# Patient Record
Sex: Female | Born: 1995 | Race: Black or African American | Hispanic: No | Marital: Single | Smoking: Never smoker
Health system: Southern US, Community
[De-identification: ages and names within clinical notes are randomized; demographics above are authoritative.]

## PROBLEM LIST (undated history)

## (undated) ENCOUNTER — Inpatient Hospital Stay (HOSPITAL_COMMUNITY): Payer: Self-pay

## (undated) DIAGNOSIS — F909 Attention-deficit hyperactivity disorder, unspecified type: Secondary | ICD-10-CM

## (undated) HISTORY — PX: NO PAST SURGERIES: SHX2092

---
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
## 2017-01-12 ENCOUNTER — Encounter (HOSPITAL_COMMUNITY): Payer: Self-pay | Admitting: Emergency Medicine

## 2017-01-12 ENCOUNTER — Emergency Department (HOSPITAL_COMMUNITY)
Admission: EM | Admit: 2017-01-12 | Discharge: 2017-01-12 | Disposition: A | Discharge status: Home / Self Care | Payer: Medicaid Other | Attending: Emergency Medicine | Admitting: Emergency Medicine

## 2017-01-12 DIAGNOSIS — F909 Attention-deficit hyperactivity disorder, unspecified type: Secondary | ICD-10-CM | POA: Insufficient documentation

## 2017-01-12 DIAGNOSIS — M545 Low back pain, unspecified: Secondary | ICD-10-CM

## 2017-01-12 LAB — PREGNANCY, URINE: Preg Test, Ur: NEGATIVE

## 2017-01-12 MED ORDER — IBUPROFEN 800 MG PO TABS: 800.0000 mg | ORAL_TABLET | Freq: Three times a day (TID) | ORAL | 0 refills | Status: DC | PRN

## 2017-01-12 NOTE — ED Triage Notes (Signed)
Pt states back pain that started 3 days ago while at work, when she stretches her back is when it is worse. Non tender to palpation. Pt states she is also on her period and might be period cramps. Also states "it feels like I need to pop my back and cannot."

## 2017-01-12 NOTE — ED Notes (Signed)
Pt ambulated to room from waiting room, tolerated well. 

## 2017-01-12 NOTE — Discharge Instructions (Signed)

## 2017-01-12 NOTE — ED Provider Notes (Signed)
Emergency Department Provider Note   I have reviewed the triage vital signs and the nursing notes.   HISTORY  Chief Complaint Back Pain   HPI Joanna Henry is a 21 y.o. female with PMH of ADHD presents to the emergency department for evaluation of lower back pain worse with movement.  She denies any vaginal discharge.  She is currently on her menstrual cycle and reports a negative pregnancy test at home today x2.  She denies any abdominal discomfort.  She states the pain began suddenly with lifting a large box at work.  She is tried Tylenol at home with significant relief in symptoms but came in today for advice on which pain medications to take.  She denies any allergies to medications.  No tingling or numbness in the arms or legs.  No difficulty walking.  No bowel or bladder incontinence.    Past Medical History:  Diagnosis Date  . ADHD (attention deficit hyperactivity disorder)     Patient Active Problem List   Diagnosis Date Noted  . 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  Current Outpatient Rx  . Order #: [id number] Class: Print  . Order #: [id number] Class: Print  . Order #: [id number] Class: Print    Allergies Patient has no known allergies.  No family history on file.  Social History Social History   Tobacco Use  . Smoking status: Never Smoker  . Smokeless tobacco: Never Used  Substance Use Topics  . Alcohol use: No  . Drug use: Yes    Types: Marijuana    Comment: daily    Review of Systems  Constitutional: No fever/chills Eyes: No visual changes. ENT: No sore throat. Cardiovascular: Denies chest pain. Respiratory: Denies shortness of breath. Gastrointestinal: No abdominal pain.  No nausea, no vomiting.  No diarrhea.  No constipation. Genitourinary: Negative for dysuria. Musculoskeletal: Positive for lower back pain. Skin: Negative for rash. Neurological: Negative for  headaches, focal weakness or numbness.  10-point ROS otherwise negative.  ____________________________________________   PHYSICAL EXAM:  VITAL SIGNS: ED Triage Vitals  Enc Vitals Group     BP 01/12/17 1754 120/85     Pulse Rate 01/12/17 1754 83     Resp 01/12/17 1754 16     Temp 01/12/17 1754 98.1 F (36.7 C)     Temp Source 01/12/17 1754 Oral     SpO2 01/12/17 1754 100 %     Pain Score 01/12/17 1759 7    Constitutional: Alert and oriented. Well appearing and in no acute distress. Eyes: Conjunctivae are normal.  Head: Atraumatic. Nose: No congestion/rhinnorhea. Mouth/Throat: Mucous membranes are moist.  Neck: No stridor.  Cardiovascular: Good peripheral circulation.  Respiratory: Normal respiratory effort.   Gastrointestinal: Soft and nontender. No distention.  Musculoskeletal: No lower extremity tenderness nor edema. No gross deformities of extremities. Mild diffuse pain to the lower back with no focal midline discomfort.  Neurologic:  Normal speech and language. No gross focal neurologic deficits are appreciated.  Skin:  Skin is warm, dry and intact. No rash noted.  ____________________________________________   LABS (all labs ordered are listed, but only abnormal results are displayed)  Labs Reviewed  PREGNANCY, URINE   ____________________________________________  RADIOLOGY  None ____________________________________________   PROCEDURES  Procedure(s) performed:   Procedures  None ____________________________________________   INITIAL IMPRESSION / ASSESSMENT AND PLAN / ED COURSE  Pertinent labs & imaging results that were available during my care of the patient  were reviewed by me and considered in my medical decision making (see chart for details).  Patient presents the emergency department for evaluation of lower back pain.  She has diffuse lower back discomfort with no midline spine tenderness.  No red flag signs or symptoms to suggest cauda  equina or other spinal cord emergency.  No lower abdominal tenderness on exam.Urine pregnancy negative. Advised her to take Tylenol at home with topical pain medications.  She will follow with primary care physician as needed.  At this time, I do not feel there is any life-threatening condition present. I have reviewed and discussed all results (EKG, imaging, lab, urine as appropriate), exam findings with patient. I have reviewed nursing notes and appropriate previous records.  I feel the patient is safe to be discharged home without further emergent workup. Discussed usual and customary return precautions. Patient and family (if present) verbalize understanding and are comfortable with this plan.  Patient will follow-up with their primary care provider. If they do not have a primary care provider, information for follow-up has been provided to them. All questions have been answered.  ____________________________________________  FINAL CLINICAL IMPRESSION(S) / ED DIAGNOSES  Final diagnoses:  Acute bilateral low back pain without sciatica     MEDICATIONS GIVEN DURING THIS VISIT:  None  NEW OUTPATIENT MEDICATIONS STARTED DURING THIS VISIT:  Motrin 800 mg   Note:  This document was prepared using Dragon voice recognition software and may include unintentional dictation errors.  Alona BeneJoshua Blinda Turek, MD Emergency Medicine    Zykee Avakian, Arlyss RepressJoshua G, MD 01/12/17 2114

## 2017-01-12 NOTE — ED Notes (Signed)
Pt verbalized understanding discharge instructions and denies any further needs or questions at this time. VS stable, ambulatory and steady gait.   

## 2017-01-15 ENCOUNTER — Ambulatory Visit (INDEPENDENT_AMBULATORY_CARE_PROVIDER_SITE_OTHER): Payer: Self-pay | Admitting: Physician Assistant

## 2017-03-31 ENCOUNTER — Encounter (HOSPITAL_COMMUNITY): Payer: Self-pay

## 2017-03-31 ENCOUNTER — Other Ambulatory Visit: Payer: Self-pay

## 2017-03-31 DIAGNOSIS — Z5321 Procedure and treatment not carried out due to patient leaving prior to being seen by health care provider: Secondary | ICD-10-CM | POA: Insufficient documentation

## 2017-03-31 DIAGNOSIS — R109 Unspecified abdominal pain: Secondary | ICD-10-CM | POA: Insufficient documentation

## 2017-03-31 LAB — URINALYSIS, ROUTINE W REFLEX MICROSCOPIC
BILIRUBIN URINE: NEGATIVE
GLUCOSE, UA: NEGATIVE mg/dL
Hgb urine dipstick: NEGATIVE
KETONES UR: NEGATIVE mg/dL
Leukocytes, UA: NEGATIVE
NITRITE: NEGATIVE
Protein, ur: NEGATIVE mg/dL
Specific Gravity, Urine: 1.025 (ref 1.005–1.030)
pH: 5 (ref 5.0–8.0)

## 2017-03-31 NOTE — ED Notes (Signed)
Pt refused blood work, will provide urine sample.

## 2017-03-31 NOTE — ED Notes (Signed)
Results reviewed

## 2017-03-31 NOTE — ED Triage Notes (Signed)
Pt states she has been having abdominal pain X3 days. States she had a large bowel movement and has been having rectal pain since. Pt states this has resolved, now has abd pain and vomiting after eating.

## 2017-04-01 ENCOUNTER — Emergency Department (HOSPITAL_COMMUNITY)
Admission: EM | Admit: 2017-04-01 | Discharge: 2017-04-01 | Discharge status: Against Medical Advice | Payer: Medicaid Other | Attending: Emergency Medicine | Admitting: Emergency Medicine

## 2017-04-01 NOTE — ED Notes (Signed)
No answer x3

## 2017-04-27 ENCOUNTER — Other Ambulatory Visit: Payer: Self-pay

## 2017-04-27 ENCOUNTER — Ambulatory Visit (HOSPITAL_COMMUNITY)
Admission: EM | Admit: 2017-04-27 | Discharge: 2017-04-27 | Disposition: A | Discharge status: Home / Self Care | Payer: Medicaid Other | Attending: Physician Assistant | Admitting: Physician Assistant

## 2017-04-27 ENCOUNTER — Ambulatory Visit (INDEPENDENT_AMBULATORY_CARE_PROVIDER_SITE_OTHER): Payer: Medicaid Other

## 2017-04-27 ENCOUNTER — Encounter (HOSPITAL_COMMUNITY): Payer: Self-pay | Admitting: Emergency Medicine

## 2017-04-27 DIAGNOSIS — M79641 Pain in right hand: Secondary | ICD-10-CM

## 2017-04-27 NOTE — Discharge Instructions (Signed)
Rest ice compression elevation.

## 2017-04-27 NOTE — ED Triage Notes (Signed)
Pt states she was upset and punched a wall and c/o R hand pain, worse on her pinkie finger.

## 2017-04-27 NOTE — ED Provider Notes (Signed)
04/27/2017 5:59 PM   DOB: [birth year] / MRN: [id number]  SUBJECTIVE:  Joanna Henry is a 22 y.o. female presenting for right hand pain that started after punching a wall.  This just occurred and she came straight here.  She has been icing the hand.  She is having a difficult time moving the hand.  She is allergic to fish allergy.   She  has a past medical history of ADHD (attention deficit hyperactivity disorder).    She  reports that  has never smoked. she has never used smokeless tobacco. She reports that she uses drugs. Drug: Marijuana. She reports that she does not drink alcohol. She  has no sexual activity history on file. The patient  has no past surgical history on file.  Her family history is not on file.  ROS  Per HPI  OBJECTIVE:  BP 119/76   Pulse 91   Temp 98.1 F (36.7 C)   Resp 18   LMP 04/06/2017   SpO2 100%   Physical Exam  Constitutional: She is active.  Non-toxic appearance.  Cardiovascular: Normal rate.  Pulmonary/Chest: Effort normal. No tachypnea.  Musculoskeletal: She exhibits tenderness ( TTP about the right fifth MCP.  There is some deformity at the joint. ).  Neurological: She is alert.  Skin: Skin is warm and dry. She is not diaphoretic. No pallor.    No results found for this or any previous visit (from the past 72 hour(s)).  Dg Hand Complete Right  Result Date: 04/27/2017 CLINICAL DATA:  Small finger pain after striking a wall. EXAM: RIGHT HAND - COMPLETE 3+ VIEW COMPARISON:  None. FINDINGS: There is no evidence of fracture or dislocation. There is no evidence of arthropathy or other focal bone abnormality. No foreign body is identified. IMPRESSION: Negative. Electronically Signed   By: Gaylyn RongWalter  Liebkemann M.D.   On: 04/27/2017 17:55    ASSESSMENT AND PLAN:  Orders Placed This Encounter  Procedures  . DG Hand Complete Right    Standing Status:   Standing    Number of Occurrences:   1    Order Specific Question:   Reason for Exam (SYMPTOM  OR  DIAGNOSIS REQUIRED)    Answer:   Right fifth MCP pain.  Mild deformity on exam.     Right hand pain: Rads negative for fracture.  I have splinted her hand for comfort.  He denies she can take this off in 24-48 hours.  Advised ice and ibuprofen.      The patient is advised to call or return to clinic if she does not see an improvement in symptoms, or to seek the care of the closest emergency department if she worsens with the above plan.   Deliah BostonMichael Clark, MHS, PA-C 04/27/2017 5:59 PM   Ofilia Neaslark, Michael L, PA-C 04/27/17 Flossie [id number]

## 2017-04-30 ENCOUNTER — Encounter (HOSPITAL_COMMUNITY): Payer: Self-pay | Admitting: Emergency Medicine

## 2017-04-30 ENCOUNTER — Other Ambulatory Visit: Payer: Self-pay

## 2017-04-30 ENCOUNTER — Ambulatory Visit (HOSPITAL_COMMUNITY)
Admission: EM | Admit: 2017-04-30 | Discharge: 2017-04-30 | Disposition: A | Discharge status: Home / Self Care | Payer: Medicaid Other | Attending: Family Medicine | Admitting: Family Medicine

## 2017-04-30 DIAGNOSIS — S6991XD Unspecified injury of right wrist, hand and finger(s), subsequent encounter: Secondary | ICD-10-CM | POA: Diagnosis not present

## 2017-04-30 NOTE — Discharge Instructions (Signed)
You may use over the counter ibuprofen or acetaminophen as needed.  ° °

## 2017-04-30 NOTE — ED Triage Notes (Signed)
Pt seen here on the 24th, for pinkie injury. Pt here for recheck, states she bumped it on the ice machine and its hurting. Pt also requesting rewrapping of her cast.

## 2017-05-07 NOTE — ED Provider Notes (Signed)
  [facility]   [id number] 04/30/17 Arrival Time: 1935  ASSESSMENT & PLAN:  1. Finger injury, right, subsequent encounter    Buddy tape for a few days. Encouraged her that she needs to begin working on ROM. OTC ibuprofen if needed. May f/u with her PCP or here if needed. Expect full resolution.  Reviewed expectations re: course of current medical issues. Questions answered. Outlined signs and symptoms indicating need for more acute intervention. Patient verbalized understanding. After Visit Summary given.  SUBJECTIVE: History from: patient. Joanna Henry is a 22 y.o. female who is here for a f/u concerning injury to her R 5th finger. Last visit reviewed. She has questions about splinting. Still with some discomfort. No extremity sensation changes or weakness. No worsening. Taking occasional ibuprofen that helps. No specific aggravating or alleviating factors reported.  ROS: As per HPI.   OBJECTIVE:  Vitals:   04/30/17 1946  BP: 119/70  Pulse: 94  Resp: 18  Temp: 97.9 F (36.6 C)  SpO2: 100%    General appearance: alert; no distress Extremities: no cyanosis or edema; symmetrical with no gross deformities; poorly localized mild tenderness over her R 5th finger with no swelling and no bruising; ROM: normal "but feels stiff" CV: normal extremity capillary refill Skin: warm and dry Neurologic: normal gait; normal symmetric reflexes in all extremities; normal sensation in all extremities Psychological: alert and cooperative; normal mood and affect  Allergies  Allergen Reactions  . Fish Allergy Nausea And Vomiting    Past Medical History:  Diagnosis Date  . ADHD (attention deficit hyperactivity disorder)    Social History   Socioeconomic History  . Marital status: Single    Spouse name: Not on file  . Number of children: Not on file  . Years of education: Not on file  . Highest education level: Not on file  Social Needs  . Financial resource strain: Not on  file  . Food insecurity - worry: Not on file  . Food insecurity - inability: Not on file  . Transportation needs - medical: Not on file  . Transportation needs - non-medical: Not on file  Occupational History  . Not on file  Tobacco Use  . Smoking status: Never Smoker  . Smokeless tobacco: Never Used  Substance and Sexual Activity  . Alcohol use: No  . Drug use: Yes    Types: Marijuana    Comment: daily  . Sexual activity: Not on file  Other Topics Concern  . Not on file  Social History Narrative   ** Merged History Encounter **       History reviewed. No pertinent surgical history.    Mardella LaymanHagler, Melanee Cordial, MD 05/07/17 [phone]

## 2017-05-28 ENCOUNTER — Encounter (HOSPITAL_COMMUNITY): Payer: Self-pay

## 2017-05-28 ENCOUNTER — Emergency Department (HOSPITAL_COMMUNITY)
Admission: EM | Admit: 2017-05-28 | Discharge: 2017-05-28 | Disposition: A | Discharge status: Home / Self Care | Payer: Medicaid Other | Attending: Emergency Medicine | Admitting: Emergency Medicine

## 2017-05-28 ENCOUNTER — Other Ambulatory Visit: Payer: Self-pay

## 2017-05-28 DIAGNOSIS — M436 Torticollis: Secondary | ICD-10-CM | POA: Insufficient documentation

## 2017-05-28 DIAGNOSIS — M542 Cervicalgia: Secondary | ICD-10-CM | POA: Diagnosis present

## 2017-05-28 MED ORDER — METHOCARBAMOL 500 MG PO TABS: 1000.0000 mg | ORAL_TABLET | Freq: Four times a day (QID) | ORAL | 0 refills | Status: DC | PRN

## 2017-05-28 MED ORDER — IBUPROFEN 800 MG PO TABS
800.0000 mg | ORAL_TABLET | Freq: Once | ORAL | Status: AC
  Administered 2017-05-28: 800 mg via ORAL
  Filled 2017-05-28: qty 1

## 2017-05-28 NOTE — ED Triage Notes (Signed)
Patient complains of awakening this am with right neck pain, burning to same with any ROM

## 2017-05-28 NOTE — Discharge Instructions (Addendum)
For pain control you may take up to 800mg of Motrin (also known as ibuprofen). That is usually 4 over the counter pills,  3 times a day. Take with food to minimize stomach irritation   ° °For breakthrough pain you may take Robaxin. Do not drink alcohol, drive or operate heavy machinery when taking Robaxin. ° °Please follow with your primary care doctor in the next 2 days for a check-up. They must obtain records for further management.  ° °Do not hesitate to return to the Emergency Department for any new, worsening or concerning symptoms.  ° ° °

## 2017-05-28 NOTE — ED Provider Notes (Signed)
[facility] EMERGENCY DEPARTMENT Provider Note   CSN: [id number] Arrival date & time: 05/28/17  1115     History   Chief Complaint CC: right neck pain   HPI   Blood pressure 114/74, pulse 78, temperature 97.7 F (36.5 C), temperature source Oral, resp. rate 18, SpO2 99 %.  Joanna Henry is a 22 y.o. female complaining of right neck pain occurring this morning when she was brushing her hair, she states it felt she felt a pop in felt immediate pain, is on the right lateral aspect of the neck.  Pain is exacerbated with rotation of the neck.  She is taken multiple over-the-counter pain medications in addition to a muscle relaxer that she was given by a coworker with little relief.  No fever, chills, headache, rash.   Past Medical History:  Diagnosis Date  . ADHD (attention deficit hyperactivity disorder)     Patient Active Problem List   Diagnosis Date Noted  . 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 No pertinent surgical history.   OB History   None      Home Medications    Prior to Admission medications   Medication Sig Start Date End Date Taking? Authorizing Provider  methocarbamol (ROBAXIN) 500 MG tablet Take 2 tablets (1,000 mg total) by mouth 4 (four) times daily as needed (Pain). 05/28/17   Severo Beber, Mardella Layman    Family History No family history on file.  Social History Social History   Tobacco Use  . Smoking status: Never Smoker  . Smokeless tobacco: Never Used  Substance Use Topics  . Alcohol use: No  . Drug use: Yes    Types: Marijuana    Comment: daily     Allergies   Fish allergy   Review of Systems Review of Systems  A complete review of systems was obtained and all systems are negative except as noted in the HPI and PMH.   Physical Exam Updated Vital Signs BP 114/74 (BP Location: Right Arm)   Pulse 78   Temp 97.7 F (36.5 C) (Oral)   Resp 18   SpO2  99%   Physical Exam  Constitutional: She is oriented to person, place, and time. She appears well-developed and well-nourished. No distress.  HENT:  Head: Normocephalic and atraumatic.  Mouth/Throat: Oropharynx is clear and moist.  Eyes: Pupils are equal, round, and reactive to light. Conjunctivae and EOM are normal.  Neck: Normal range of motion.  Cardiovascular: Normal rate, regular rhythm and intact distal pulses.  Pulmonary/Chest: Effort normal and breath sounds normal.  Abdominal: Soft. There is no tenderness.  Musculoskeletal: Normal range of motion. She exhibits tenderness.       Arms: To palpation along the right trapezius as diagrammed, no midline C-spine tenderness  Neurological: She is alert and oriented to person, place, and time.  Skin: She is not diaphoretic.  Psychiatric: She has a normal mood and affect.  Nursing note and vitals reviewed.    ED Treatments / Results  Labs (all labs ordered are listed, but only abnormal results are displayed) Labs Reviewed - No data to display  EKG None  Radiology No results found.  Procedures Procedures (including critical care time)  Medications Ordered in ED Medications  ibuprofen (ADVIL,MOTRIN) tablet 800 mg (has no administration in time range)     Initial Impression / Assessment and Plan / ED Course  I have reviewed the triage vital signs and the nursing notes.  Pertinent labs & imaging results that were available during my care of the patient were reviewed by me and considered in my medical decision making (see chart for details).     Vitals:   05/28/17 1124  BP: 114/74  Pulse: 78  Resp: 18  Temp: 97.7 F (36.5 C)  TempSrc: Oral  SpO2: 99%    Medications  ibuprofen (ADVIL,MOTRIN) tablet 800 mg (has no administration in time range)    Joanna Henry is 22 y.o. female presenting with right neck pain onset this morning when she was brushing her hair.  This appears to be clearly a trapezius muscle spasm.   Patient given option of IM Toradol and she declines.  Recommend warmth and massage.  Short prescription of Robaxin and high-dose ibuprofen at home  Evaluation does not show pathology that would require ongoing emergent intervention or inpatient treatment. Pt is hemodynamically stable and mentating appropriately. Discussed findings and plan with patient/guardian, who agrees with care plan. All questions answered. Return precautions discussed and outpatient follow up given.    Final Clinical Impressions(s) / ED Diagnoses   Final diagnoses:  Torticollis, acute    ED Discharge Orders        Ordered    methocarbamol (ROBAXIN) 500 MG tablet  4 times daily PRN     05/28/17 1457       Naveena Eyman, Mardella Laymanicole, PA-C 05/28/17 1501    Melene PlanFloyd, Dan, DO 05/28/17 [phone]

## 2017-12-17 ENCOUNTER — Other Ambulatory Visit: Payer: Self-pay

## 2017-12-17 ENCOUNTER — Emergency Department (HOSPITAL_COMMUNITY)
Admission: EM | Admit: 2017-12-17 | Discharge: 2017-12-17 | Disposition: A | Discharge status: Home / Self Care | Payer: Medicaid Other | Attending: Emergency Medicine | Admitting: Emergency Medicine

## 2017-12-17 ENCOUNTER — Encounter (HOSPITAL_COMMUNITY): Payer: Self-pay | Admitting: Emergency Medicine

## 2017-12-17 DIAGNOSIS — R4589 Other symptoms and signs involving emotional state: Secondary | ICD-10-CM

## 2017-12-17 DIAGNOSIS — F419 Anxiety disorder, unspecified: Secondary | ICD-10-CM

## 2017-12-17 DIAGNOSIS — F22 Delusional disorders: Secondary | ICD-10-CM

## 2017-12-17 DIAGNOSIS — F909 Attention-deficit hyperactivity disorder, unspecified type: Secondary | ICD-10-CM | POA: Insufficient documentation

## 2017-12-17 DIAGNOSIS — F333 Major depressive disorder, recurrent, severe with psychotic symptoms: Secondary | ICD-10-CM | POA: Insufficient documentation

## 2017-12-17 MED ORDER — LORAZEPAM 2 MG/ML IJ SOLN
1.0000 mg | Freq: Once | INTRAMUSCULAR | Status: DC
  Filled 2017-12-17: qty 1

## 2017-12-17 MED ORDER — HYDROXYZINE HCL 25 MG PO TABS
25.0000 mg | ORAL_TABLET | Freq: Once | ORAL | Status: AC
Start: 2017-12-17 — End: 2017-12-17
  Administered 2017-12-17: 25 mg via ORAL
  Filled 2017-12-17: qty 1

## 2017-12-17 MED ORDER — ACETAMINOPHEN 325 MG PO TABS
650.0000 mg | ORAL_TABLET | ORAL | Status: DC | PRN
  Filled 2017-12-17: qty 2

## 2017-12-17 MED ORDER — ONDANSETRON HCL 4 MG PO TABS: 4.0000 mg | ORAL_TABLET | Freq: Three times a day (TID) | ORAL | Status: DC | PRN

## 2017-12-17 MED ORDER — FLUOXETINE HCL 20 MG PO CAPS
20.0000 mg | ORAL_CAPSULE | Freq: Every day | ORAL | Status: DC
  Administered 2017-12-17: 20 mg via ORAL
  Filled 2017-12-17: qty 1

## 2017-12-17 MED ORDER — TRAZODONE HCL 50 MG PO TABS
50.0000 mg | ORAL_TABLET | Freq: Every evening | ORAL | Status: DC | PRN
  Filled 2017-12-17: qty 1

## 2017-12-17 MED ORDER — LORAZEPAM 1 MG PO TABS
1.0000 mg | ORAL_TABLET | Freq: Once | ORAL | Status: DC
  Filled 2017-12-17: qty 1

## 2017-12-17 NOTE — ED Notes (Signed)
Pt is in a lot of distress, pt is constantly crying and uptight currently

## 2017-12-17 NOTE — [facility] Counselor (Signed)
Pt continues to be anxious about 1) blood draw for labs & 2) spending night in hospital. Spoke with pt to allay fears & encourage her to expedite med management of anxiety and depression sx by going through with inpt tx. Pt concerned she will regret leaving hospital but could not get comfortable. Pt was given outpt resource information - [facility] for walk-in appt & list of several other local outpt tx providers. Spoke with mother in ED who states pt will stay with her tonight & assist pt with getting to outpt care. Spoke with Nira Conn, NP, to advise pt not wanting inpt tx & signing out AMA. Nira Conn, NP confirms pt does not meet IVC criteria.

## 2017-12-17 NOTE — ED Provider Notes (Signed)
[facility] EMERGENCY DEPARTMENT Provider Note   CSN: [id number] Arrival date & time: 12/17/17  1453     History   Chief Complaint Chief Complaint  Patient presents with  . Anxiety    HPI Perrie Ragin is a 22 y.o. female w PMHx ADHD, presenting to the ED with complaint of panic attach and depression. Pt states she started a new job at [employer], and since then she feels like her life is falling apart. She states she doesn't feel herself. Is lonely, depressed and having problems controlling her anger. She attributes her short temper to her father. She reports having thoughts of picking up and leaving everything behind. Reports feeling worried to leave her house because [place] Otto is unsafe. Denies SI, HI, auditory of visual hallucinations. Does states when she has the thoughts of leaving, she imagines her grandmother's voice, though does not actually hear her talking.   The history is provided by the patient.    Past Medical History:  Diagnosis Date  . ADHD (attention deficit hyperactivity disorder)     Patient Active Problem List   Diagnosis Date Noted  . 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 No pertinent surgical history.   OB History   None      Home Medications    Prior to Admission medications   Medication Sig Start Date End Date Taking? Authorizing Provider  methocarbamol (ROBAXIN) 500 MG tablet Take 2 tablets (1,000 mg total) by mouth 4 (four) times daily as needed (Pain). Patient not taking: Reported on 12/17/2017 05/28/17   Pisciotta, Joni Reining, PA-C    Family History No family history on file.  Social History Social History   Tobacco Use  . Smoking status: Never Smoker  . Smokeless tobacco: Never Used  Substance Use Topics  . Alcohol use: No  . Drug use: Yes    Types: Marijuana    Comment: daily     Allergies   Fish allergy   Review of  Systems Review of Systems  Psychiatric/Behavioral: Positive for dysphoric mood. Negative for hallucinations, self-injury and suicidal ideas. The patient is nervous/anxious.   All other systems reviewed and are negative.    Physical Exam Updated Vital Signs BP 128/77 (BP Location: Right Arm)   Pulse 100   Resp (!) 22   Ht 5\' 6"  (1.676 m)   SpO2 99%   BMI 31.39 kg/m   Physical Exam  Constitutional: She appears well-developed and well-nourished.  Tearful, anxious  HENT:  Head: Normocephalic and atraumatic.  Eyes: Conjunctivae are normal.  Cardiovascular: Normal rate and intact distal pulses.  Pulmonary/Chest: Effort normal.  Abdominal: Soft.  Neurological: She is alert.  Skin: Skin is warm.  Psychiatric: Her speech is normal and behavior is normal. Her mood appears anxious. She exhibits a depressed mood. She expresses no homicidal and no suicidal ideation.  Nursing note and vitals reviewed.   ED Treatments / Results  Labs (all labs ordered are listed, but only abnormal results are displayed) Labs Reviewed  COMPREHENSIVE METABOLIC PANEL  ETHANOL  RAPID URINE DRUG SCREEN, HOSP PERFORMED  CBC WITH DIFFERENTIAL/PLATELET  I-STAT BETA HCG BLOOD, ED ([facility], [facility], [facility] ONLY)  I-STAT BETA HCG BLOOD, ED ([facility], [facility], [facility] ONLY)    EKG None  Radiology No results found.  Procedures Procedures (including critical care time)  Medications Ordered in ED Medications  FLUoxetine (PROZAC) capsule 20 mg (20 mg Oral Given 12/17/17 1924)  traZODone (  DESYREL) tablet 50 mg (has no administration in time range)  acetaminophen (TYLENOL) tablet 650 mg (has no administration in time range)  ondansetron (ZOFRAN) tablet 4 mg (has no administration in time range)  LORazepam (ATIVAN) tablet 1 mg (1 mg Oral Not Given 12/17/17 2037)  hydrOXYzine (ATARAX/VISTARIL) tablet 25 mg (25 mg Oral Given 12/17/17 1535)     Initial Impression / Assessment and Plan / ED Course  I have reviewed the triage vital  signs and the nursing notes.  Pertinent labs & imaging results that were available during my care of the patient were reviewed by me and considered in my medical decision making (see chart for details).     Patient presenting with complaint of anxiety, depression, and paranoia dated she denies SI or HI or hallucinations.  Patient appears very anxious on exam and is tearful.  Gave hydroxyzine without much improvement in patient's affect. TTS evaluated this patient recommended inpatient however there is no bed available and patient will need to spend the night. Pt agreeable to plan. She is very tearful and anxious throughout ED visit and became more anxious when attempting to collect medical clearance labs due to fear of needles. For this reason, labs pending.   She continues to deny SI or HI, or symptoms of psychosis.  Do not feel patient is a threat to herself or others at this time.  Agree with TTS evaluation.  Final Clinical Impressions(s) / ED Diagnoses   Final diagnoses:  Anxiety  Dysphoric mood  Paranoia [facility])    ED Discharge Orders    None       Duanne Duchesne, [place] N, PA-C 12/17/17 2150    Gwyneth Sprout, MD 12/17/17 2353

## 2017-12-17 NOTE — Consult Note (Signed)
Spoke with Edman Circle, LCSW with TTS.  Informed patient presented worsened anxiety that has started to become paranoia and that she is afraid to come out of house.  Patient is about to lose her job and feels that she is unable to function.  Patient has a past history of suicide attempt but at this time patient denies suicidal/homicidal ideation and psychosis.  Patient states that she feels everyone is out to harm her and she is afraid to go anywhere.    Recommended inpatient for patient and to start Prozac 20 mg po daily and Trazodone 50 mg Q hs prn for insomnia.   Orders entered.    Deirdre Benedetto Goad, LCSW will inform EDP of recommendations and order entry   Shuvon B. Rankin, NP

## 2017-12-17 NOTE — ED Notes (Signed)
Pt belongings are in [place] [facility] locker #3

## 2017-12-17 NOTE — ED Notes (Signed)
Psychiatrist at bedside

## 2017-12-17 NOTE — ED Notes (Signed)
Mother has arrived and being held at [facility]; [facility] counselor is going to speak with mother at this time; Patient told counselor that if she is given medication ordered she will agree to stay; RN has not confirmed that with patient at this time; RN to await for [facility] after speaking with mother -[facility]

## 2017-12-17 NOTE — BH Assessment (Signed)
Tele Assessment Note   Patient Name: Joanna Henry MRN: [id number] Referring Physician: Robinson, [place] N, PA-C Location of Patient: [facility] Location of Provider: Behavioral Health TTS Department  Joanna Henry is a single 22 y.o. female  who presents voluntarily to [facility] with sx of an acute anxiety attack, with pt reporting her body becoming numb and her vision blurred.  Pt has a history of anxiety. She presents very tearful and self-critical of her anxiety sx. Pt reports taking no current medications. Pt denies current suicidal ideation. She reports 1 past suicide attempt by OD in 2016. Pt reports she feels scared all the the time. She states she is afraid to leave her home. Pt asked "Don't you listen to the news?!" Pt states she feels paranoid daily. She reports she has felt afraid her whole life and it has gotten worse recently. Pt states she takes buses for transportation & she feels watched by others at times on bus who she thinks may mean her harm. Pt reports she has reduced the specific times of the day she will let herself be outside due to fear of something bad happening.  Pt acknowledges multiple symptoms of Depression including: isolating herself, anhedonia, tearfulness, feelings of guilt, reduced appetite, interrupted sleep & increased irritability. Pt denies homicidal ideation/ history of violence. Pt denies AVH, but states recently she can hear her own voice in her head giving the angel/devil opinions on a given action. Pt states current stressors include financial, due to employer cutting her hours without warning; relationship conflict due to her irritability and later regret; intense fear she will be like her father who is angry and anxious.   Pt lives with her boyfriend and sister, and supports include her sister and mother. Pt denies hx of abuse or trauma.  Pt's work history includes work at [employer]. 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 Pt has fair insight and judgment. Pt's memory is intact. She reports no history of legal problems. ? Pt's OP history includes none. She has been reluctant to consider medications since taking Focalin for ADHD several years ago, which she didn't like.  IP history is unclear. Pt thought she had brief tx at [facility] after OD in 2017, but record reflects she only presented to ED. Pt denies alcohol/ substance abuse. ? MSE: Pt is casually dressed, alert, disheveled from hard crying, oriented x4 with normal speech and restless motor behavior. Eye contact is good. Pt's mood is anxious and affect is depressed and anxious. Affect is congruent with mood. Thought process is coherent and relevant. There is no indication pt is currently responding to internal stimuli or experiencing delusional thought content. Pt was cooperative throughout assessment.    Disposition:  Due to Depression with increasing paranoia, Joanna Rankin, NP recommends inpatient psychiatric tx.   Diagnosis: F33.3 MDD, recurrent severe with psychotic sx  Past Medical History:  Past Medical History:  Diagnosis Date  . ADHD (attention deficit hyperactivity disorder)     History reviewed. No pertinent surgical history.  Family History: No family history on file.  Social History:  reports that she has never smoked. She has never used smokeless tobacco. She reports that she has current or past drug history. Drug: Marijuana. She reports that she does not drink alcohol.  Additional Social History:  Alcohol / Drug Use Pain Medications: see PTA meds Prescriptions: see PTA meds Over  the Counter: see PTA meds History of alcohol / drug use?: No history of alcohol / drug abuse  CIWA: CIWA-Ar BP: 128/77 Pulse Rate: 100 COWS:    Allergies:  Allergies  Allergen Reactions  . Fish Allergy Nausea And Vomiting    Home Medications:  (Not in a hospital admission)  OB/GYN Status:  No LMP  recorded.  General Assessment Data Location of Assessment: [facility] ED TTS Assessment: In system Is this a Tele or Face-to-Face Assessment?: Face-to-Face Is this an Initial Assessment or a Re-assessment for this encounter?: Initial Assessment Patient Accompanied by:: N/A Marital status: Long term relationship Pregnancy Status: No Living Arrangements: Non-relatives/Friends, Other relatives(boyfriend & sister) Can pt return to current living arrangement?: Yes Admission Status: Voluntary Is patient capable of signing voluntary admission?: Yes Referral Source: Self/Family/Friend Insurance type: medicaid     Crisis Care Plan Living Arrangements: Non-relatives/Friends, Other relatives(boyfriend & sister) Name of Psychiatrist: None Name of Therapist: None  Education Status Is patient currently in school?: No Is the patient employed, unemployed or receiving disability?: Employed  Risk to self with the past 6 months Suicidal Ideation: No Has patient been a risk to self within the past 6 months prior to admission? : No Suicidal Intent: No Has patient had any suicidal intent within the past 6 months prior to admission? : No Is patient at risk for suicide?: No Suicidal Plan?: No Has patient had any suicidal plan within the past 6 months prior to admission? : No Previous Attempts/Gestures: Yes How many times?: 1 Triggers for Past Attempts: Unknown Intentional Self Injurious Behavior: None Family Suicide History: Unknown Recent stressful life event(s): Turmoil (Comment), Conflict (Comment), Financial Problems(reduced work hours; relationship stresses) Persecutory voices/beliefs?: No Depression: Yes Depression Symptoms: Despondent, Insomnia, Tearfulness, Isolating, Fatigue, Guilt, Loss of interest in usual pleasures, Feeling angry/irritable Substance abuse history and/or treatment for substance abuse?: No Suicide prevention information given to non-admitted patients: Yes  Risk to Others  within the past 6 months Homicidal Ideation: No Does patient have any lifetime risk of violence toward others beyond the six months prior to admission? : No Thoughts of Harm to Others: No Current Homicidal Intent: No Current Homicidal Plan: No Access to Homicidal Means: No History of harm to others?: No Assessment of Violence: None Noted Does patient have access to weapons?: No Criminal Charges Pending?: No Does patient have a court date: No Is patient on probation?: No  Psychosis Hallucinations: None noted Delusions: None noted  Mental Status Report Appearance/Hygiene: Disheveled Eye Contact: Good Motor Activity: Freedom of movement Speech: Logical/coherent Level of Consciousness: Alert, Crying Mood: Anxious, Despair Affect: Anxious, Depressed Anxiety Level: Moderate Thought Processes: Coherent, Relevant Judgement: Partial Orientation: Person, Place, Time, Situation Obsessive Compulsive Thoughts/Behaviors: Minimal  Cognitive Functioning Concentration: Fair Memory: Recent Intact, Remote Intact Is patient IDD: No Insight: Fair Impulse Control: Fair Appetite: Poor Have you had any weight changes? : Loss Amount of the weight change? (lbs): (unknown) Sleep: Decreased Total Hours of Sleep: 6(at most. Interrupted often)  ADLScreening [facility] Assessment Services) Patient's cognitive ability adequate to safely complete daily activities?: Yes Patient able to express need for assistance with ADLs?: Yes Independently performs ADLs?: Yes (appropriate for developmental age)  Prior Inpatient Therapy Prior Inpatient Therapy: No  Prior Outpatient Therapy Prior Outpatient Therapy: No Does patient have an ACCT team?: No Does patient have Intensive In-House Services?  : No Does patient have [facility] services? : No Does patient have P4CC services?: No  ADL Screening (condition at time of admission) Patient's cognitive ability  adequate to safely complete daily activities?: Yes Is  the patient deaf or have difficulty hearing?: No Does the patient have difficulty seeing, even when wearing glasses/contacts?: No Does the patient have difficulty concentrating, remembering, or making decisions?: No Patient able to express need for assistance with ADLs?: Yes Does the patient have difficulty dressing or bathing?: No Independently performs ADLs?: Yes (appropriate for developmental age) Does the patient have difficulty walking or climbing stairs?: No Weakness of Legs: None Weakness of Arms/Hands: None  Home Assistive Devices/Equipment Home Assistive Devices/Equipment: None  Therapy Consults (therapy consults require a physician order) PT Evaluation Needed: No OT Evalulation Needed: No SLP Evaluation Needed: No Abuse/Neglect Assessment (Assessment to be complete while patient is alone) Abuse/Neglect Assessment Can Be Completed: Yes Physical Abuse: Denies Verbal Abuse: Denies Sexual Abuse: Denies Exploitation of patient/patient's resources: Denies Values / Beliefs Cultural Requests During Hospitalization: None Spiritual Requests During Hospitalization: None Consults Spiritual Care Consult Needed: No Social Work Consult Needed: No Merchant navy officer (For Healthcare) Does Patient Have a Medical Advance Directive?: No Would patient like information on creating a medical advance directive?: No - Patient declined          Disposition: Due to Depression with increasing paranoia, Joanna Rankin, NP recommends inpatient psychiatric tx. Disposition Initial Assessment Completed for this Encounter: Yes Disposition of Patient: Admit Type of inpatient treatment program: Adult  This service was provided via telemedicine using a 2-way, interactive audio and video technology.  Names of all persons participating in this telemedicine service and their role in this encounter.               Joanna Henry H Joanna Henry 12/17/2017 7:59 PM

## 2017-12-17 NOTE — ED Notes (Addendum)
Charge RN notified of situation and in unit at this time; pt has states she wants to leave at this time; belongings returned to patient and patient signed AMA form; [facility] counselor escorted patient to her mother who was waiting in ED waiting room-Monique,RN

## 2017-12-17 NOTE — Progress Notes (Signed)
Pt meets inpatient criteria per Assunta Found, NP. Referral information has been faxed to the following hospitals for review:  [facility]  [facility]  [facility]  [facility]  [facility]  [facility]  [facility]   Disposition CSWs will continue to assist with placement needs.   Wells Guiles, LCSW, LCAS Disposition CSW [facility]/TTS [phone]

## 2017-12-17 NOTE — ED Triage Notes (Signed)
Pt brought in by [facility] from home. Pt came in for anxiety attack. Pt is stressed about life and becoming like her father. Pt is unhappy about her job. Pt mostly needs psych consult. 118/70 BP 110 HR 99% RA

## 2017-12-17 NOTE — ED Notes (Signed)
Patient's mother called to check on patient;Pt at the desk arguing with her mother; pt continues to states she wants to leave and we can not hold her; RN is holding medications for now due to sleep aid ordered; If patient stays medications will be given; RN has sent EMT to retrieve patients valuables from Security at this time; pt asked for her clothes so she can go; RN to have patient sign out-Monique,RN

## 2017-12-17 NOTE — ED Notes (Signed)
[facility] counselor is  currently speaking with pt in room; RN standing by to sign pt out AMA via EDP; Patient's mother called back to advise she is on the way to pick her up because patient threaten to walk home-Monique,RN

## 2017-12-17 NOTE — ED Notes (Addendum)
Pt refused Ativan medication. Meds returned to pyxis

## 2017-12-17 NOTE — ED Notes (Signed)
Patient arrived to unit very anxious and tearful; pt states she wants to go home; pt attempted to call mother with no answer and became very aggitated; PA notified and at bedside to reassess if IVC needs to be done; Per EDP  Patient is able leave if she desires; pt committed to stay as long as she was given food, and a warm blanket, and pain meds; pt is currently lying in bed watching TV at this time-Monique,RN

## 2018-03-04 NOTE — L&D Delivery Note (Addendum)
Delivery Note At 8:38 PM a viable female was delivered via  (Presentation: Vertex; ROA).  APGAR: 8, 9 ; weight see delivery summary.   Placenta status: Spontaneous, intact.  Cord: 3 vessels with the following complications: none.  Anesthesia: Epidural Episiotomy:  None Lacerations: 1st degree perineal Suture Repair: vicryl Est. Blood Loss (mL): 380   Mom to postpartum.  Baby to Couplet care / Skin to Skin.  Gerlene Fee, DO Family Medicine Resident, PGY-1 09/20/2018, 8:46 PM  Joanna Henry is a 23 y.o. 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  She progressed with cytotec and pitocin augmentation to complete and pushed 30 minutes to deliver.  Cord clamping delayed by several minutes then clamped by CNM and cut by FOB. Delivery call made for IUGR, see NICU note. After FOB cut cord, infant taken to warmer to be assessed by NICU team.    I was gloved and present for delivery in its entirety.  Second stage of labor progressed and delivered without complication. CNM completed repair in third stage without complication.  Wende Mott, CNM 10:34 PM

## 2018-03-07 ENCOUNTER — Emergency Department (HOSPITAL_COMMUNITY)
Admission: EM | Admit: 2018-03-07 | Discharge: 2018-03-07 | Disposition: A | Discharge status: Home / Self Care | Payer: Medicaid Other | Attending: Emergency Medicine | Admitting: Emergency Medicine

## 2018-03-07 ENCOUNTER — Encounter (HOSPITAL_COMMUNITY): Payer: Self-pay | Admitting: Emergency Medicine

## 2018-03-07 DIAGNOSIS — R112 Nausea with vomiting, unspecified: Secondary | ICD-10-CM | POA: Diagnosis not present

## 2018-03-07 DIAGNOSIS — Z3A01 Less than 8 weeks gestation of pregnancy: Secondary | ICD-10-CM

## 2018-03-07 DIAGNOSIS — F1290 Cannabis use, unspecified, uncomplicated: Secondary | ICD-10-CM | POA: Diagnosis not present

## 2018-03-07 DIAGNOSIS — O26891 Other specified pregnancy related conditions, first trimester: Secondary | ICD-10-CM | POA: Diagnosis not present

## 2018-03-07 DIAGNOSIS — O219 Vomiting of pregnancy, unspecified: Secondary | ICD-10-CM | POA: Diagnosis not present

## 2018-03-07 DIAGNOSIS — O2391 Unspecified genitourinary tract infection in pregnancy, first trimester: Secondary | ICD-10-CM | POA: Diagnosis not present

## 2018-03-07 DIAGNOSIS — R8271 Bacteriuria: Secondary | ICD-10-CM | POA: Insufficient documentation

## 2018-03-07 LAB — URINALYSIS, ROUTINE W REFLEX MICROSCOPIC
Bilirubin Urine: NEGATIVE
GLUCOSE, UA: NEGATIVE mg/dL
HGB URINE DIPSTICK: NEGATIVE
Ketones, ur: 80 mg/dL — AB
Leukocytes, UA: NEGATIVE
Nitrite: NEGATIVE
PROTEIN: 30 mg/dL — AB
Specific Gravity, Urine: 1.02 (ref 1.005–1.030)
pH: 7 (ref 5.0–8.0)

## 2018-03-07 LAB — POC URINE PREG, ED: Preg Test, Ur: POSITIVE — AB

## 2018-03-07 MED ORDER — DOXYLAMINE SUCCINATE (SLEEP) 25 MG PO TABS: 25.0000 mg | ORAL_TABLET | Freq: Once | ORAL | Status: DC

## 2018-03-07 MED ORDER — PRENATAL VITAMINS 0.8 MG PO TABS: 1.0000 | ORAL_TABLET | Freq: Every day | ORAL | 0 refills | Status: DC

## 2018-03-07 MED ORDER — DIPHENHYDRAMINE HCL 25 MG PO CAPS
25.0000 mg | ORAL_CAPSULE | ORAL | Status: AC
  Administered 2018-03-07: 25 mg via ORAL
  Filled 2018-03-07: qty 1

## 2018-03-07 MED ORDER — CEPHALEXIN 500 MG PO CAPS: 500.0000 mg | ORAL_CAPSULE | Freq: Three times a day (TID) | ORAL | 0 refills | Status: AC

## 2018-03-07 NOTE — ED Provider Notes (Signed)
[facility] EMERGENCY DEPARTMENT Provider Note   CSN: [id number] Arrival date & time: 03/07/18  1245     History   Chief Complaint Chief Complaint  Patient presents with  . Emesis    HPI Joanna Henry is a 23 y.o. female.  Joanna Henry is a 23 y.o. female with a history of ADHD, who presents to the emergency department for evaluation of decreased appetite nausea and vomiting.  Patient reports that she does not vomit daily, but frequently feels nauseated, last episode of vomiting this morning after trying to eat breakfast.  She reports she has at most 2 episodes of vomiting per day, this is been intermittent and ongoing over the past month.  She denies any associated abdominal pain.  No hematemesis.  No diarrhea, melena or hematochezia.  No fevers or chills.  No dysuria or urinary frequency.  Last menstrual cycle was on November 15, and she is not currently on any birth control, is sexually active with one female partner.  She denies any vaginal bleeding or vaginal discharge, no pelvic pain or discomfort.  No previous pregnancies.  She has not taken anything to treat her symptoms, no other aggravating or alleviating factors.     Past Medical History:  Diagnosis Date  . ADHD (attention deficit hyperactivity disorder)     Patient Active Problem List   Diagnosis Date Noted  . 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 No pertinent surgical history.   OB History   No obstetric history on file.      Home Medications    Prior to Admission medications   Medication Sig Start Date End Date Taking? Authorizing Provider  Prenatal Multivit-Min-Fe-FA (PRENATAL VITAMINS) 0.8 MG tablet Take 1 tablet by mouth daily. 03/07/18   Dartha Lodge, PA-C    Family History History reviewed. No pertinent family history.  Social History Social History   Tobacco Use  . Smoking status: Never Smoker  . Smokeless tobacco:  Never Used  Substance Use Topics  . Alcohol use: No  . Drug use: Yes    Types: Marijuana    Comment: daily     Allergies   Fish allergy   Review of Systems Review of Systems  Constitutional: Negative for chills and fever.  HENT: Negative.   Eyes: Negative for visual disturbance.  Respiratory: Negative for cough and shortness of breath.   Cardiovascular: Negative for chest pain.  Gastrointestinal: Positive for nausea and vomiting. Negative for abdominal pain, blood in stool, constipation and diarrhea.  Genitourinary: Positive for menstrual problem. Negative for dysuria, flank pain, frequency, hematuria, pelvic pain, vaginal bleeding, vaginal discharge and vaginal pain.  Musculoskeletal: Negative for arthralgias and myalgias.  Skin: Negative for color change and rash.  Neurological: Negative for dizziness, syncope, light-headedness and headaches.     Physical Exam Updated Vital Signs BP 126/83   Pulse 84   Temp 99 F (37.2 C) (Oral)   Resp 18   LMP 01/19/2018   SpO2 99%   Physical Exam Vitals signs and nursing note reviewed.  Constitutional:      General: She is not in acute distress.    Appearance: Normal appearance. She is well-developed. She is not ill-appearing, toxic-appearing or diaphoretic.  HENT:     Head: Normocephalic and atraumatic.     Mouth/Throat:     Mouth: Mucous membranes are moist.     Pharynx: Oropharynx is clear.  Eyes:     General:  Right eye: No discharge.        Left eye: No discharge.     Pupils: Pupils are equal, round, and reactive to light.  Neck:     Musculoskeletal: Neck supple.  Cardiovascular:     Rate and Rhythm: Normal rate and regular rhythm.     Pulses: Normal pulses.     Heart sounds: Normal heart sounds. No murmur. No friction rub. No gallop.   Pulmonary:     Effort: Pulmonary effort is normal. No respiratory distress.     Breath sounds: Normal breath sounds. No wheezing or rales.     Comments: Respirations equal  and unlabored, patient able to speak in full sentences, lungs clear to auscultation bilaterally Abdominal:     General: Abdomen is flat. Bowel sounds are normal. There is no distension.     Palpations: Abdomen is soft. There is no mass.     Tenderness: There is no abdominal tenderness. There is no guarding.     Comments: Abdomen soft, nondistended, nontender to palpation in all quadrants without guarding or peritoneal signs  Musculoskeletal:        General: No deformity.  Skin:    General: Skin is warm and dry.     Capillary Refill: Capillary refill takes less than 2 seconds.  Neurological:     Mental Status: She is alert and oriented to person, place, and time. Mental status is at baseline.     Coordination: Coordination normal.     Comments: Speech is clear, able to follow commands CN III-XII intact Normal strength in upper and lower extremities bilaterally including dorsiflexion and plantar flexion, strong and equal grip strength Sensation normal to light and sharp touch Moves extremities without ataxia, coordination intact   Psychiatric:        Mood and Affect: Mood normal.        Behavior: Behavior normal.      ED Treatments / Results  Labs (all labs ordered are listed, but only abnormal results are displayed) Labs Reviewed  URINALYSIS, ROUTINE W REFLEX MICROSCOPIC - Abnormal; Notable for the following components:      Result Value   Color, Urine AMBER (*)    APPearance CLOUDY (*)    Ketones, ur 80 (*)    Protein, ur 30 (*)    Bacteria, UA MANY (*)    All other components within normal limits  POC URINE PREG, ED - Abnormal; Notable for the following components:   Preg Test, Ur POSITIVE (*)    All other components within normal limits    EKG None  Radiology No results found.  Procedures Procedures (including critical care time)  Medications Ordered in ED Medications  diphenhydrAMINE (BENADRYL) capsule 25 mg (25 mg Oral Given 03/07/18 1612)     Initial  Impression / Assessment and Plan / ED Course  I have reviewed the triage vital signs and the nursing notes.  Pertinent labs & imaging results that were available during my care of the patient were reviewed by me and considered in my medical decision making (see chart for details).  Patient presents for evaluation of several weeks of intermittent nausea and vomiting and decreased appetite.  Last menstrual period was November 15.  No associated abdominal pain, no pelvic discomfort, vaginal bleeding or vaginal discharge, denies any urinary symptoms.  Has not taken anything to treat the symptoms.  Arrival patient appears anxious, initially tachycardic but this resolved without intervention, patient appears well-hydrated and nontoxic.  Abdominal exam is benign.  Urine pregnancy and urinalysis obtained from triage, positive pregnancy test, urinalysis with some bacteria but no other signs of infection.  Discussed positive pregnancy test with patient this is likely the cause of her symptoms.  This is her first pregnancy.  The patient is not having any abdominal pain, has not had any vaginal bleeding or discharge, based on last menstrual period is at most [redacted] weeks pregnant, I discussed with the patient given her reassuring symptoms I do not think ultrasound or further work-up is indicated here in the emergency department but that she will need to follow-up with an OB/GYN for further prenatal care.  Discussed using doxylamine, vitamin B6 and ginger as needed for nausea and eating small frequent meals.  Patient tolerating p.o. fluids and food here in the emergency department with no vomiting.  Will treat with Keflex for asymptomatic bacteriuria in pregnancy.  Discussed appropriate return precautions.  Patient expresses understanding and agreement with plan.  Discharged home in good condition.  Final Clinical Impressions(s) / ED Diagnoses   Final diagnoses:  Less than [redacted] weeks gestation of pregnancy  Non-intractable  vomiting with nausea, unspecified vomiting type  Asymptomatic bacteriuria    ED Discharge Orders         Ordered    Prenatal Multivit-Min-Fe-FA (PRENATAL VITAMINS) 0.8 MG tablet  Daily     03/07/18 1632    cephALEXin (KEFLEX) 500 MG capsule  3 times daily     03/07/18 1810           Dartha Lodge, [place] 03/08/18 1449    Tilden Fossa, MD 03/09/18 [phone]

## 2018-03-07 NOTE — ED Triage Notes (Addendum)
Pt states she has been under a lot of stress recently. Pt missed her period in December. Pt took a pregnancy test that was negative. Pt states she has no appetite, feels nauseous and has been vomiting since Decemeber. Pt states she is smoking marijuana to be able to eat. Pt also complains of lower back pain.

## 2018-03-07 NOTE — Discharge Instructions (Signed)
Your pregnancy test is positive today.  Your nausea and vomiting is likely due to morning sickness from your pregnancy.  You can use doxylamine (Unisom) which can be purchased over-the-counter, you can also take vitamin B6 supplement and use ginger candies to help soothe your stomach.  I encourage you to try and eat frequent small meals to help keep something on your stomach as this usually is very helpful in combating nausea.  Please begin taking prenatal vitamins once daily.  You will need to call Monday morning to schedule follow-up appointment with an OB/GYN for continued prenatal care.  Return to the emergency department or the MAU at [facility] if you develop abdominal pain, vaginal bleeding or any other new or concerning symptoms.

## 2018-03-23 ENCOUNTER — Encounter: Payer: Self-pay | Admitting: General Practice

## 2018-03-23 ENCOUNTER — Ambulatory Visit (INDEPENDENT_AMBULATORY_CARE_PROVIDER_SITE_OTHER): Payer: Medicaid Other | Admitting: *Deleted

## 2018-03-23 ENCOUNTER — Other Ambulatory Visit: Payer: Self-pay | Admitting: Obstetrics and Gynecology

## 2018-03-23 ENCOUNTER — Other Ambulatory Visit (HOSPITAL_COMMUNITY)
Admission: RE | Admit: 2018-03-23 | Discharge: 2018-03-23 | Disposition: A | Discharge status: Home / Self Care | Payer: Medicaid Other | Source: Ambulatory Visit | Attending: Obstetrics and Gynecology | Admitting: Obstetrics and Gynecology

## 2018-03-23 ENCOUNTER — Other Ambulatory Visit: Payer: Self-pay

## 2018-03-23 VITALS — BP 111/72 | HR 105 | Temp 97.9°F | Wt 172.0 lb

## 2018-03-23 DIAGNOSIS — Z3481 Encounter for supervision of other normal pregnancy, first trimester: Secondary | ICD-10-CM | POA: Diagnosis not present

## 2018-03-23 DIAGNOSIS — Z13228 Encounter for screening for other metabolic disorders: Secondary | ICD-10-CM | POA: Diagnosis not present

## 2018-03-23 DIAGNOSIS — Z3400 Encounter for supervision of normal first pregnancy, unspecified trimester: Secondary | ICD-10-CM

## 2018-03-23 DIAGNOSIS — O0990 Supervision of high risk pregnancy, unspecified, unspecified trimester: Secondary | ICD-10-CM | POA: Insufficient documentation

## 2018-03-23 DIAGNOSIS — O219 Vomiting of pregnancy, unspecified: Secondary | ICD-10-CM

## 2018-03-23 LAB — POCT URINALYSIS DIPSTICK OB
Bilirubin, UA: NEGATIVE
Blood, UA: NEGATIVE
Glucose, UA: NEGATIVE
Ketones, UA: NEGATIVE
Leukocytes, UA: NEGATIVE
Nitrite, UA: NEGATIVE
Spec Grav, UA: 1.025
Urobilinogen, UA: 0.2 U/dL
pH, UA: 6

## 2018-03-23 MED ORDER — ONDANSETRON 4 MG PO TBDP: 4.0000 mg | ORAL_TABLET | Freq: Three times a day (TID) | ORAL | 0 refills | Status: DC | PRN

## 2018-03-23 NOTE — Progress Notes (Signed)
     PRENATAL INTAKE SUMMARY  Ms. Buccieri presents today New OB Nurse Interview.  OB History    Gravida  1   Para      Term      Preterm      AB      Living        SAB      TAB      Ectopic      Multiple      Live Births             I have reviewed the patient's medical, obstetrical, social, and family histories, medications, and available lab results.  SUBJECTIVE She has no unusual complaints and complains of nausea with vomiting for 5 days. Patient also smoke marijuana daily to increase appetite.   OBJECTIVE Initial nurse interview for history and lab work (New OB).  Warm shower helps with n/v.  EDD: 10/23/2018 GA:  [redacted]w[redacted]d G1P0 FHT: 164  GENERAL APPEARANCE: alert, well appearing, in no apparent distress, oriented to person, place and time   ASSESSMENT Normal pregnancy  PLAN Prenatal care-[facility]/HIV  OB Urine Culture/Dip GC/CT(urine) HgbEval at next visit [facility] ([facility]) [facility] Ultrasound <14 weeks ordered to confirm dating and viability. Patient to purchase over the counter Prenatal gummies. Zofran 4 mg ODT #30 sent to pharmacy per verbal order Raelyn Mora, CNM. Advised patient to stop smoking marijuana ASAP due to hyperemesis cannabis. Instructions given on AVS. Flu declined today  Genetic Screening Results Information: You are having genetic testing called [facility] today.  It will take approximately 2 weeks before the results are available.  To get your results, you need Internet access to a web browser to search [facility]/MyChart (the direct app on your phone will not give you these results).  Then select Lab Scanned and click on the blue hyper link that says View Image to see your [facility] results.  You can also use the directions on the purple card given to look up your results directly on the [place] website.

## 2018-03-23 NOTE — Patient Instructions (Addendum)
Genetic Screening Results Information: You are having genetic testing called [facility] today.  It will take approximately 2 weeks before the results are available.  To get your results, you need Internet access to a web browser to search [place]/MyChart (the direct app on your phone will not give you these results).  Then select Lab Scanned and click on the blue hyper link that says View Image to see your Panorama results.  You can also use the directions on the purple card given to look up your results directly on the [place] website. Preventing Mercury Exposure Mercury is a naturally occurring metal found in air, water, and soil. It can be harmful to your health if you are exposed to a lot of it. Mercury gets into the environment through:  Spills.  Throwing away things that contain mercury, such as fluorescent light bulbs or glass thermometers.  Burning certain materials, such as coal and hazardous waste. How can mercury exposure affect me or my loved ones? Exposure to mercury can harm you in several ways. It can:  Harm your kidneys, heart, and nervous system.  Affect your skin by causing skin rashes.  Cause memory problems.  Harm a growing baby during pregnancy or breastfeeding.  Cause developmental delays in children. What can increase my risk? You are more likely to be exposed to mercury if:  You eat seafood. Fish and shellfish may contain mercury if it is in the water where they were caught. Large fish that eat other fish may have more mercury in their bodies compared with smaller fish.  You work in a place where mercury is used, such as a Theme park manager or certain industrial jobs. What actions can I take to prevent exposure to mercury? Eating and drinking   Do not drink water that has been contaminated with mercury.  Eat fish and shellfish that are known to have lower levels of mercury. These  include: ? Salmon. ? Shrimp. ? [place]. ? Pollock. ? Tilapia. ? Catfish. ? Cod.  Avoid eating fish that tend to be higher in mercury. These include: ? Tilefish. ? Shark. ? Swordfish. ? King mackerel. Worksite precautions   Follow worksite precautions if you work near mercury. This includes: ? Avoid direct contact with mercury. ? Wear chemical protective clothing and equipment, such as a mask, gloves, and goggles.  If you get mercury on your clothing: ? Quickly take off all contaminated clothing and quickly and gently blot or brush away excess chemical. ? Wash gently and thoroughly with lukewarm, gently flowing water and non-abrasive soap for 5 minutes. ? Dispose of the contaminated clothing according to your workplace's guidelines.  If you get mercury on your face: ? Quickly and gently blot or brush chemical off the face. ? If you get the chemical in or near your eyes, immediately flush the contaminated eye(s) with lukewarm, gently flowing water for 5 minutes, while holding the eyelid(s) open.  If you swallow or inhale mercury: ? Rinse your mouth with water. ? Move to fresh air.  Talk to your employer to make sure they comply with national safety standards. General instructions  Call your local poison control center or your health care provider immediately if you have been exposed to mercury.  Read seafood advisories. Check information from your local health department to find out whether seafood and shellfish caught in your area are safe to eat.  Dispose of mercury safely. Find out how to properly throw away thermometers, light bulbs, and other items that contain mercury.  Check with your local government office. You can also check online for guidelines from the U.S. Dietitian (EPA): http://gordon.net/ Where to find more information Visit these websites to learn more about mercury exposure:  U.S. Dietitian (EPA):  http://gordon.net/  U.S. Centers for Disease Control and Prevention (CDC): [employer].co.uk  Poison Centers/Poison Help: http://www.graves-ford.org/ For information about mercury exposure at work:  U.[place]. Teacher, music (OSHA): MemorabiliaMart.si For more information about mercury in seafood:  Visit the U.S. Diplomatic Services operational officer) website: https://brooks.com/ Get help right away if you:  Have symptoms of mercury exposure, such as: ? Tremors. ? Mood or behavior changes. ? Skin rash. ? Memory problems. ? Coordination problems. ? Difficulty breathing. ? Coughing. ? Nausea and vomiting. ? Diarrhea. ? Eye irritation. ? Changes in vision. If you have been exposed to mercury, contact your local poison control center (1-(518)814-7019 in the [place] Summary  Mercury is a naturally occurring metal found in air, water, and soil. It can be harmful to your health if you are exposed to a lot of it.  You are more likely to be exposed to mercury if you eat seafood or you work in a place where mercury is used.  To prevent exposure to mercury, check with your local health department to find out whether seafood and shellfish caught in your area are safe to eat. Eat mostly the types of fish and shellfish that are least likely to have mercury in them.  Take steps at work to protect yourself from being exposed to mercury. This may include wearing protective equipment and following your workplace's rules. This information is not intended to replace advice given to you by your health care provider. Make sure you discuss any questions you have with your health care provider. Document Released: 02/21/2003 Document Revised: 03/26/2017 Document Reviewed: 03/26/2017 Elsevier Interactive Patient Education  2019 [employer].  Marijuana Use During Pregnancy and Breastfeeding  Marijuana is the dried leaves, flowers, and stems of the Cannabis sativa or Cannabis  indica plant. The plant's active ingredients (cannabinoids), including a chemical called THC, change the chemistry of the brain. Marijuana smoke also has many of the same chemicals as cigarette smoke that cause breathing problems. Marijuana gets into your blood through your lungs when you smoke it and through your digestive system when you swallow it. Using marijuana in any form may be harmful for you and your baby when you are trying to become pregnant and during pregnancy. This includes marijuana that is prescribed to you by a health care provider (medical marijuana). Once marijuana is in your blood, it can travel through your placenta to your baby. It may also pass through breast milk. How does this affect me? Marijuana affects you both mentally and physically. Using marijuana can make you feel high and relaxed. It can also have negative effects, especially at high doses or with long-term use. These include:  Rapid heartbeat and stress on your heart.  Lung irritation and breathing problems.  Difficulty thinking and making decisions.  Seeing or believing things that are not true (hallucinations and paranoia).  Mood swings, depression, or anxiety.  Decreased ability to learn and remember.  Difficulty getting pregnant. Marijuana can also affect your pregnancy. Not all the effects are known. However, if you use marijuana during pregnancy, you may:  Be less likely to get regular prenatal care and do the things that you need to do to have a healthy pregnancy.  Be more likely to use other drugs  that can harm your pregnancy, like drinking alcohol and smoking cigarettes.  Be at higher risk of having your baby die after 28 weeks of pregnancy (stillbirth).  Be at higher risk of giving birth before 37 weeks of pregnancy (premature birth). How does this affect my baby? If you use marijuana during pregnancy, this may affect your baby's development, birth, and life after birth. Your baby may:  Be  born prematurely, which can cause physical and mental problems.  Be born with a low birth weight, which can lead to physical and mental problems.  Have problems with brain development.  Have difficulty growing.  Have attention and behavior problems later in life.  Do poorly at school and have learning problems later in life.  Have problems with vision and coordination.  Be at higher risk for using marijuana by age 49. More research is needed to find out exactly how marijuana affects a baby during breastfeeding. Some studies suggest that the chemicals in marijuana can be passed to a baby through breast milk. To limit possible risks, you should not use marijuana during breastfeeding. Follow these instructions at home:  Let your health care provider know if you use marijuana before trying to get pregnant, during pregnancy, or during breastfeeding.  Do not use marijuana in any form when you are trying to get pregnant, when you are pregnant, or when you are breastfeeding. If you are having trouble stopping marijuana use, ask your health care provider for help.  Do not smoke. If you need help quitting, ask your health care provider for help.  If you are using medical marijuana, ask your health care provider to switch you to a medicine that is safer to use during pregnancy or breastfeeding.  Keep all your prenatal visits as told by your health care provider. This is important. Where to find more information [employer] on Drug Abuse: www.drugabuse.gov March of Dimes: www.marchofdimes.org/pregnancy Contact a health care provider if:  You use marijuana and want to get pregnant.  You use marijuana during pregnancy or breastfeeding.  You need help stopping marijuana use. Get help right away if:  Your baby is not gaining weight or growing as expected. Summary  Using marijuana in any form may be harmful for you and your baby when you are trying to become pregnant, during  pregnancy, and during breastfeeding. This includes marijuana that is prescribed to you (medical marijuana).  Some studies suggest that marijuana may pass through breast milk and can affect your baby's brain development.  Talk to your health care provider if you use marijuana in any form while trying to get pregnant, during pregnancy, or while breastfeeding.  Ask your health care provider for help if you are not able to stop using marijuana. This information is not intended to replace advice given to you by your health care provider. Make sure you discuss any questions you have with your health care provider. Document Released: 11/06/2016 Document Revised: 11/06/2016 Document Reviewed: 11/06/2016 Elsevier Interactive Patient Education  2019 Elsevier Inc.  Cannabinoid Hyperemesis Syndrome Cannabinoid hyperemesis syndrome (CHS) is a condition that causes repeated nausea, vomiting, and abdominal pain after long-term (chronic) use of marijuana (cannabis). People with CHS typically use marijuana 3-5 times a day for many years before they have symptoms, although it is possible to develop CHS with as little as 1 use per day. Symptoms of CHS may be mild at first but can get worse and more frequent. In some cases, CHS may cause vomiting many times a day,  which can lead to weight loss and dehydration. CHS may go away and come back many times (recur). People may not have symptoms or may otherwise be healthy in between [facility] attacks. What are the causes? The exact cause of this condition is not known. Long-term use of marijuana may over-stimulate certain proteins in the brain that react with chemicals in marijuana (cannabinoid receptors). This over-stimulation may cause CHS. What are the signs or symptoms? Symptoms of this condition are often mild during the first few attacks, but they can get worse over time. Symptoms may include:  Frequent nausea, especially early in the morning.  Vomiting.  Abdominal  pain. Taking several hot showers throughout the day can also be a sign of this condition. People with CHS may do this because it relieves symptoms. How is this diagnosed? This condition may be diagnosed based on:  Your symptoms and medical history, including any drug use.  A physical exam. You may have tests done to rule out other problems. These tests may include:  Blood tests.  Urine tests.  Imaging tests, such as an X-ray or CT scan. How is this treated? Treatment for this condition involves stopping marijuana use. Your health care provider may recommend:  A drug rehabilitation program, if you have trouble stopping marijuana use.  Medicines for nausea.  Hot showers to help relieve symptoms. Certain creams that contain a substance called capsaicin may improve symptoms when applied to the abdomen. Ask your health care provider before starting any medicines or other treatments. Severe nausea and vomiting may require you to stay at the hospital. You may need IV fluids to prevent or treat dehydration. You may also need certain medicines that must be given at the hospital. Follow these instructions at home: During an attack   Stay in bed and rest in a dark, quiet room.  Take anti-nausea medicine as told by your health care provider.  Try taking hot showers to relieve your symptoms. After an attack  Drink small amounts of clear fluids slowly. Gradually add more.  Once you are able to eat without vomiting, eat soft foods in small amounts every 3-4 hours. General instructions   Do not use any products that contain marijuana.If you need help quitting, ask your health care provider for resources and treatment options.  Drink enough fluid to keep your urine pale yellow. Avoid drinking fluids that have a lot of sugar or caffeine, such as coffee and soda.  Take and apply over-the-counter and prescription medicines only as told by your health care provider. Ask your health care  provider before starting any new medicines or treatments.  Keep all follow-up visits as told by your health care provider. This is important. Contact a health care provider if:  Your symptoms get worse.  You cannot drink fluids without vomiting.  You have pain and trouble swallowing after an attack. Get help right away if:  You cannot stop vomiting.  You have blood in your vomit or your vomit looks like coffee grounds.  You have severe abdominal pain.  You have stools that are bloody or black, or stools that look like tar.  You have symptoms of dehydration, such as: ? Sunken eyes. ? Inability to make tears. ? Cracked lips. ? Dry mouth. ? Decreased urine production. ? Weakness. ? Sleepiness. ? Fainting. Summary  Cannabinoid hyperemesis syndrome (CHS) is a condition that causes repeated nausea, vomiting, and abdominal pain after long-term use of marijuana.  People with CHS typically use marijuana 3-5 times a  day for many years before they have symptoms, although it is possible to develop CHS with as little as 1 use per day.  Treatment for this condition involves stopping marijuana use. Hot showers and capsaicin creams may also help relieve symptoms. Ask your health care provider before starting any medicines or other treatments.  Your health care provider may prescribe medicines to help with nausea.  Get help right away if you have signs of dehydration, such as dry mouth, decreased urine production, or weakness. This information is not intended to replace advice given to you by your health care provider. Make sure you discuss any questions you have with your health care provider. Document Released: 05/29/2016 Document Revised: 05/29/2016 Document Reviewed: 05/29/2016 Elsevier Interactive Patient Education  2019 [employer]

## 2018-03-24 LAB — OBSTETRIC PANEL, INCLUDING HIV
Antibody Screen: NEGATIVE
Basophils Absolute: 0 10*3/uL (ref 0.0–0.2)
Basos: 0 %
EOS (ABSOLUTE): 0.1 10*3/uL (ref 0.0–0.4)
Eos: 1 %
HIV Screen 4th Generation wRfx: NONREACTIVE
Hematocrit: 33.7 % — ABNORMAL LOW (ref 34.0–46.6)
Hemoglobin: 11.8 g/dL (ref 11.1–15.9)
Hepatitis B Surface Ag: NEGATIVE
Immature Grans (Abs): 0 10*3/uL (ref 0.0–0.1)
Immature Granulocytes: 0 %
LYMPHS: 26 %
Lymphocytes Absolute: 1.4 10*3/uL (ref 0.7–3.1)
MCH: 29 pg (ref 26.6–33.0)
MCHC: 35 g/dL (ref 31.5–35.7)
MCV: 83 fL (ref 79–97)
MONOCYTES: 8 %
Monocytes Absolute: 0.4 10*3/uL (ref 0.1–0.9)
Neutrophils Absolute: 3.4 10*3/uL (ref 1.4–7.0)
Neutrophils: 65 %
Platelets: 345 10*3/uL (ref 150–450)
RBC: 4.07 x10E6/uL (ref 3.77–5.28)
RDW: 13 % (ref 11.7–15.4)
RPR Ser Ql: NONREACTIVE
RUBELLA: 1.34 {index} (ref >0.99)
Rh Factor: POSITIVE
WBC: 5.3 10*3/uL (ref 3.4–10.8)

## 2018-03-24 LAB — URINE CYTOLOGY ANCILLARY ONLY
Chlamydia: NEGATIVE
NEISSERIA GONORRHEA: NEGATIVE
TRICH (WINDOWPATH): NEGATIVE

## 2018-03-25 LAB — CULTURE, OB URINE

## 2018-03-25 LAB — URINE CULTURE, OB REFLEX

## 2018-03-25 LAB — URINE CYTOLOGY ANCILLARY ONLY
Bacterial vaginitis: NEGATIVE
Candida vaginitis: NEGATIVE

## 2018-03-27 ENCOUNTER — Ambulatory Visit (HOSPITAL_COMMUNITY)
Admission: RE | Admit: 2018-03-27 | Discharge: 2018-03-27 | Disposition: A | Discharge status: Home / Self Care | Payer: Medicaid Other | Source: Ambulatory Visit | Attending: Obstetrics and Gynecology | Admitting: Obstetrics and Gynecology

## 2018-03-27 ENCOUNTER — Other Ambulatory Visit: Payer: Self-pay | Admitting: Obstetrics and Gynecology

## 2018-03-27 DIAGNOSIS — Z3400 Encounter for supervision of normal first pregnancy, unspecified trimester: Secondary | ICD-10-CM

## 2018-03-27 DIAGNOSIS — Z3A11 11 weeks gestation of pregnancy: Secondary | ICD-10-CM | POA: Diagnosis not present

## 2018-03-27 DIAGNOSIS — O209 Hemorrhage in early pregnancy, unspecified: Secondary | ICD-10-CM | POA: Diagnosis not present

## 2018-04-01 ENCOUNTER — Telehealth: Payer: Self-pay | Admitting: *Deleted

## 2018-04-01 ENCOUNTER — Encounter: Payer: Self-pay | Admitting: General Practice

## 2018-04-01 NOTE — Telephone Encounter (Signed)
[facility] representative called stating patient is at High Risk for Monosomy X. Genetic counseling and diagnostic testing should be considered for further evaluation. Risk at 50% (1/2). Will forward to provider.  Clovis Pu, RN

## 2018-04-03 ENCOUNTER — Other Ambulatory Visit: Payer: Self-pay | Admitting: Obstetrics and Gynecology

## 2018-04-03 DIAGNOSIS — Q969 Turner's syndrome, unspecified: Secondary | ICD-10-CM

## 2018-04-03 NOTE — Progress Notes (Signed)
After review of Panorama results, genetic counselor referral. Preferably prior to NOB visit.

## 2018-04-06 ENCOUNTER — Other Ambulatory Visit: Payer: Self-pay | Admitting: Obstetrics and Gynecology

## 2018-04-06 ENCOUNTER — Telehealth: Payer: Self-pay | Admitting: General Practice

## 2018-04-06 ENCOUNTER — Telehealth: Payer: Self-pay | Admitting: Obstetrics and Gynecology

## 2018-04-06 DIAGNOSIS — O285 Abnormal chromosomal and genetic finding on antenatal screening of mother: Secondary | ICD-10-CM

## 2018-04-06 NOTE — Telephone Encounter (Signed)
Patient aware of appointment scheduled on 04/13/17 at 11:00am for Genetic Counseling.

## 2018-04-06 NOTE — Progress Notes (Signed)
Orders entry

## 2018-04-06 NOTE — Telephone Encounter (Signed)
Left message with patient's mother for patient to give our office a call back in regards to an appt.

## 2018-04-06 NOTE — Progress Notes (Signed)
Ultrasound less than 14 weeks ordered per MFM request. To be completed prior to MFM genetic counseling appointment.  Clovis Pu, RN

## 2018-04-06 NOTE — Addendum Note (Signed)
Addended by: Clovis Pu on: 04/06/2018 11:37 AM   Modules accepted: Orders

## 2018-04-06 NOTE — Telephone Encounter (Signed)
Per MFM, patient will need to have another 14 our less [place] prior to [facility] appt.  Patient aware of appt rescheduled to 04/15/18 at 10:00am.

## 2018-04-06 NOTE — Telephone Encounter (Signed)
TC to patient to discuss abnormal findings (High Risk for Monosomy X) on Panorama screening. Brief definition of Monosomy X given to patient. Advised patient that a referral to a genetic counselor is recommended d/t to these high risk results. Patient had multiple questions about if the results of this tests mean that her baby has "a problem?"; Can the "problem" be fixed while pregnant?; Can the "problem" be fixed after the baby is born?; Would the FOB need to be tested as well?; What would be the next step? Emphasized to patient this is why it is necessary for her to see a Dentist; who is an expert on abnormal genetic screenings. Patient became very upset and placed the call on speaker phone so that her supervisors, Lilia Argue and Sallee Lange; who were present and witness to the entire call. Explained to patient that this is a screening test and we cannot be certain of any diagnosis until after further testing and additional ultrasounds are done. Patient states that she has a "meeting " on Monday to talk with a genetics counselor and have another ultrasound.  Raelyn Mora, CNM

## 2018-04-07 ENCOUNTER — Encounter: Payer: Self-pay | Admitting: General Practice

## 2018-04-08 ENCOUNTER — Encounter (HOSPITAL_COMMUNITY): Payer: Self-pay

## 2018-04-08 ENCOUNTER — Encounter: Payer: Self-pay | Admitting: General Practice

## 2018-04-13 ENCOUNTER — Other Ambulatory Visit (HOSPITAL_COMMUNITY): Payer: Self-pay | Admitting: Obstetrics and Gynecology

## 2018-04-13 ENCOUNTER — Encounter (HOSPITAL_COMMUNITY): Payer: Self-pay

## 2018-04-13 ENCOUNTER — Ambulatory Visit (HOSPITAL_COMMUNITY)
Admission: RE | Admit: 2018-04-13 | Discharge: 2018-04-13 | Disposition: A | Discharge status: Home / Self Care | Payer: Medicaid Other | Source: Ambulatory Visit | Attending: Obstetrics and Gynecology | Admitting: Obstetrics and Gynecology

## 2018-04-13 ENCOUNTER — Ambulatory Visit (HOSPITAL_COMMUNITY): Payer: Medicaid Other

## 2018-04-13 DIAGNOSIS — Z363 Encounter for antenatal screening for malformations: Secondary | ICD-10-CM

## 2018-04-13 DIAGNOSIS — O289 Unspecified abnormal findings on antenatal screening of mother: Secondary | ICD-10-CM | POA: Diagnosis not present

## 2018-04-13 DIAGNOSIS — Z3A14 14 weeks gestation of pregnancy: Secondary | ICD-10-CM | POA: Diagnosis not present

## 2018-04-13 DIAGNOSIS — Z368A Encounter for antenatal screening for other genetic defects: Secondary | ICD-10-CM | POA: Insufficient documentation

## 2018-04-15 ENCOUNTER — Ambulatory Visit (HOSPITAL_COMMUNITY): Admission: RE | Admit: 2018-04-15 | Payer: Medicaid Other | Source: Ambulatory Visit

## 2018-04-15 ENCOUNTER — Ambulatory Visit (HOSPITAL_COMMUNITY): Payer: Self-pay | Admitting: Obstetrics and Gynecology

## 2018-04-15 ENCOUNTER — Ambulatory Visit (HOSPITAL_COMMUNITY)
Admission: RE | Admit: 2018-04-15 | Discharge: 2018-04-15 | Disposition: A | Discharge status: Home / Self Care | Payer: Medicaid Other | Source: Ambulatory Visit | Attending: Obstetrics and Gynecology | Admitting: Obstetrics and Gynecology

## 2018-04-17 ENCOUNTER — Encounter: Payer: Self-pay | Admitting: Obstetrics and Gynecology

## 2018-04-21 ENCOUNTER — Emergency Department (HOSPITAL_COMMUNITY)
Admission: EM | Admit: 2018-04-21 | Discharge: 2018-04-21 | Disposition: A | Discharge status: Home / Self Care | Payer: Medicaid Other | Attending: Emergency Medicine | Admitting: Emergency Medicine

## 2018-04-21 ENCOUNTER — Other Ambulatory Visit: Payer: Self-pay

## 2018-04-21 ENCOUNTER — Encounter (HOSPITAL_COMMUNITY): Payer: Self-pay

## 2018-04-21 DIAGNOSIS — Z5321 Procedure and treatment not carried out due to patient leaving prior to being seen by health care provider: Secondary | ICD-10-CM | POA: Insufficient documentation

## 2018-04-21 DIAGNOSIS — R1031 Right lower quadrant pain: Secondary | ICD-10-CM | POA: Insufficient documentation

## 2018-04-21 LAB — CBC
HCT: 36.1 % (ref 36.0–46.0)
Hemoglobin: 12.1 g/dL (ref 12.0–15.0)
MCH: 28.9 pg (ref 26.0–34.0)
MCHC: 33.5 g/dL (ref 30.0–36.0)
MCV: 86.2 fL (ref 80.0–100.0)
Platelets: 318 10*3/uL (ref 150–400)
RBC: 4.19 MIL/uL (ref 3.87–5.11)
RDW: 12.9 % (ref 11.5–15.5)
WBC: 7 10*3/uL (ref 4.0–10.5)
nRBC: 0 % (ref 0.0–0.2)

## 2018-04-21 LAB — HCG, QUANTITATIVE, PREGNANCY: hCG, Beta Chain, Quant, S: 17078 m[IU]/mL — ABNORMAL HIGH (ref <5)

## 2018-04-21 LAB — COMPREHENSIVE METABOLIC PANEL
ALT: 17 U/L (ref 0–44)
AST: 14 U/L — ABNORMAL LOW (ref 15–41)
Albumin: 3.7 g/dL (ref 3.5–5.0)
Alkaline Phosphatase: 34 U/L — ABNORMAL LOW (ref 38–126)
Anion gap: 10 (ref 5–15)
BUN: 5 mg/dL — ABNORMAL LOW (ref 6–20)
CO2: 22 mmol/L (ref 22–32)
Calcium: 9.9 mg/dL (ref 8.9–10.3)
Chloride: 104 mmol/L (ref 98–111)
Creatinine, Ser: 0.51 mg/dL (ref 0.44–1.00)
GFR calc Af Amer: >60 mL/min (ref >60)
GFR calc non Af Amer: >60 mL/min (ref >60)
Glucose, Bld: 93 mg/dL (ref 70–99)
Potassium: 4.4 mmol/L (ref 3.5–5.1)
Sodium: 136 mmol/L (ref 135–145)
Total Bilirubin: 0.4 mg/dL (ref 0.3–1.2)
Total Protein: 7.4 g/dL (ref 6.5–8.1)

## 2018-04-21 LAB — URINALYSIS, ROUTINE W REFLEX MICROSCOPIC
Bilirubin Urine: NEGATIVE
Glucose, UA: NEGATIVE mg/dL
Hgb urine dipstick: NEGATIVE
Ketones, ur: NEGATIVE mg/dL
Leukocytes,Ua: NEGATIVE
Nitrite: NEGATIVE
Protein, ur: NEGATIVE mg/dL
Specific Gravity, Urine: 1.005 (ref 1.005–1.030)
pH: 7 (ref 5.0–8.0)

## 2018-04-21 LAB — LIPASE, BLOOD: Lipase: 31 U/L (ref 11–51)

## 2018-04-21 MED ORDER — SODIUM CHLORIDE 0.9% FLUSH: 3.0000 mL | Freq: Once | INTRAVENOUS | Status: DC

## 2018-04-21 NOTE — ED Notes (Signed)
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 Pt states that she needs to eat and will not wait to see a provider.  Pt ambulatory to the waiting room on own assistance.

## 2018-04-21 NOTE — ED Notes (Signed)
Pt told reg they were leaving.

## 2018-04-21 NOTE — ED Notes (Signed)
Pt called for blood to be drawn, pt understood. Pt became very incompliant while in treatment room, this NT asked if pt denies blood draw multiple times. Pt ask if blood isn't drawn what would happen, this NT explained delay in care when refusing blood draw. Pt understood and wanted blood to be drawn from hand, this NT attempted blood draw pt became aggravated when needle in pt's hand and started screaming and cussing at this NT. Laura(EMT) came in to make sure pt was okay. Pt started cussing at [facility]) and started throwing things in the room after this NT collected blood. Pt asked to calm down multiple times, pt refused and continued to cuss at staff. Pt also stated "hated this hospital and doesn't want to birth baby at this hospital" and pt stormed out of room while mumbling cuss words. Kayla(RN) Sort RN notified about pt's behavior.
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
## 2018-04-23 ENCOUNTER — Institutional Professional Consult (permissible substitution): Payer: Self-pay

## 2018-05-07 ENCOUNTER — Other Ambulatory Visit: Payer: Self-pay

## 2018-05-07 ENCOUNTER — Encounter: Payer: Self-pay | Admitting: Obstetrics and Gynecology

## 2018-05-07 ENCOUNTER — Other Ambulatory Visit (HOSPITAL_COMMUNITY)
Admission: RE | Admit: 2018-05-07 | Discharge: 2018-05-07 | Disposition: A | Discharge status: Home / Self Care | Payer: Medicaid Other | Source: Ambulatory Visit | Attending: Obstetrics and Gynecology | Admitting: Obstetrics and Gynecology

## 2018-05-07 ENCOUNTER — Ambulatory Visit (INDEPENDENT_AMBULATORY_CARE_PROVIDER_SITE_OTHER): Payer: Medicaid Other | Admitting: Obstetrics and Gynecology

## 2018-05-07 VITALS — BP 113/74 | HR 94 | Wt 175.2 lb

## 2018-05-07 DIAGNOSIS — O285 Abnormal chromosomal and genetic finding on antenatal screening of mother: Secondary | ICD-10-CM | POA: Diagnosis not present

## 2018-05-07 DIAGNOSIS — Z3400 Encounter for supervision of normal first pregnancy, unspecified trimester: Secondary | ICD-10-CM

## 2018-05-07 DIAGNOSIS — Z3A17 17 weeks gestation of pregnancy: Secondary | ICD-10-CM

## 2018-05-07 NOTE — Progress Notes (Signed)
Subjective:    Joanna Henry is being seen today for her first obstetrical visit.  This is not a planned pregnancy. She is at [redacted]w[redacted]d gestation. Her obstetrical history is significant for none. Currently pregnancy is high risk for Monosomy X. Has seen genetic counselor. She declined amniocentesis. Relationship with FOB: significant other, living together. Patient does intend to breast feed. Pregnancy history fully reviewed. Sister of FOB states that their mother (who is the PGM to the fetus) has a "touch of cerebral palsy."  Patient reports no complaints. Nervous about the possibility of baby having Turner's Syndrome. "Wants to know the sex of the baby."  Review of Systems:   Review of Systems  Constitutional: Negative.   HENT: Negative.   Eyes: Negative.   Respiratory: Negative.   Cardiovascular: Negative.   Gastrointestinal: Negative.   Endocrine: Negative.   Genitourinary: Negative.   Musculoskeletal: Negative.   Skin: Negative.   Allergic/Immunologic: Negative.   Neurological: Negative.   Hematological: Negative.   Psychiatric/Behavioral: Negative.     Objective:     BP 113/74   Pulse 94   Wt 175 lb 3.2 oz (79.5 kg)   LMP 01/16/2018 (Exact Date)   BMI 28.28 kg/m  Physical Exam  Nursing note and vitals reviewed. Constitutional: She is oriented to person, place, and time. She appears well-developed and well-nourished.  HENT:  Head: Normocephalic and atraumatic.  Eyes: Pupils are equal, round, and reactive to light.  Neck: Normal range of motion. Neck supple.  Cardiovascular: Normal rate, regular rhythm, normal heart sounds and intact distal pulses.  Respiratory: Effort normal and breath sounds normal.  GI: Soft. Bowel sounds are normal.  Genitourinary:    Vulva normal.     Vaginal discharge (moderate thick, white) present.     Genitourinary Comments: Speculum exam: moderate amount of thick, white vaginal discharge   Musculoskeletal: Normal range of motion.   Neurological: She is alert and oriented to person, place, and time. She has normal reflexes.  Skin: Skin is warm and dry.  Psychiatric: She has a normal mood and affect. Her behavior is normal. Judgment and thought content normal.    Maternal Exam:  Abdomen: Patient reports no abdominal tenderness. Fundal height is 17.   Fetal presentation: no presenting part  Introitus: Normal vulva. Vagina is positive for vaginal discharge (moderate thick, white).  Ferning test: not done.  Nitrazine test: not done. Amniotic fluid character: not assessed.  Pelvis: adequate for delivery.   Cervix: Cervix evaluated by sterile speculum exam.     Fetal Exam Fetal Monitor Review: Mode: hand-held doppler probe.   Baseline rate: 163.      Assessment:    Pregnancy: G1P0 Patient Active Problem List   Diagnosis Date Noted  . Supervision of normal first pregnancy, antepartum 03/23/2018  . 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 Reviewed results of Panorama -- detailed discussion prior to today's visit. Prenatal vitamins. Problem list reviewed and updated. AFP3 discussed: ordered. Role of ultrasound in pregnancy discussed; fetal survey: ordered. Amniocentesis discussed: declined. Offered by Dentist. The nature of [facility] with multiple MDs and other Advanced Practice Providers was explained to patient; also emphasized that residents, students are part of our team. Follow up with [facility] MDs only in 4 weeks. 50% of 40 min visit spent on counseling and coordination of care.  Fetal echo, OB MFM Detailed U/S and  MFM consult.   Raelyn Mora, MSN, CNM 05/07/2018

## 2018-05-07 NOTE — Progress Notes (Signed)
a 

## 2018-05-08 LAB — CERVICOVAGINAL ANCILLARY ONLY
BACTERIAL VAGINITIS: NEGATIVE
Candida vaginitis: NEGATIVE
Chlamydia: NEGATIVE
Neisseria Gonorrhea: NEGATIVE
Trichomonas: NEGATIVE

## 2018-05-09 LAB — AFP TETRA
DIA Mom Value: 0.45
DIA Value (EIA): 66.83 pg/mL
DSR (By Age)    1 IN: 1143
DSR (SECOND TRIMESTER) 1 IN: 10000
GESTATIONAL AGE AFP: 17 wk
MSAFP Mom: 0.52
MSAFP: 19.6 ng/mL
MSHCG Mom: 0.31
MSHCG: 9440 m[IU]/mL
Maternal Age At EDD: 21.3 yr
Osb Risk: 10000
T18 (By Age): 1:4452 {titer}
Test Results:: NEGATIVE
Weight: 175 [lb_av]
uE3 Mom: 1.04
uE3 Value: 1.09 ng/mL

## 2018-05-11 ENCOUNTER — Encounter: Payer: Self-pay | Admitting: General Practice

## 2018-05-13 ENCOUNTER — Encounter (HOSPITAL_COMMUNITY): Payer: Self-pay

## 2018-05-15 ENCOUNTER — Telehealth: Payer: Self-pay | Admitting: General Practice

## 2018-05-15 NOTE — Telephone Encounter (Signed)
Called [facility] to check on status of referral.  Patient is scheduled for 06/04/2018 at 11:00am.  Patient is aware of appointment.

## 2018-05-19 ENCOUNTER — Telehealth: Payer: Self-pay | Admitting: General Practice

## 2018-05-19 NOTE — Telephone Encounter (Signed)
Patient called and left message on nurse voicemail line stating she has a question about working.

## 2018-05-20 ENCOUNTER — Telehealth: Payer: Self-pay | Admitting: General Practice

## 2018-05-20 NOTE — Telephone Encounter (Signed)
Late entry from 05/19/2018.  Patient called and had questions about her appointments that are scheduled.  Informed patient of [place] and OB appointment.  Patient was referred to [facility] and was confused about the date and time, because she said that they rescheduled it.  I told her that she will need to call that office to confirm bc I can not see that appointment bc it's not in our system and bc it's outside of [facility].  Patient wanted to know if it was safe for her to work bc of the COVID-19 and that she had questions.  I explained that I am not clinical and RN was not available.  Informed patient to practice good hand hygiene techniques and use sanitizer.  Also, she was informed to call [facility] with any other questions or concerns bc she is a patient in that office.  Patient transferred from [facility] to [facility] on 05/07/2018 d/t patient is considered High Risk.  Patient verbalized understanding.  Called [facility] to confirm when patient appointment is scheduled.  They rescheduled her appointment for  07/06/2018 at 8:00am.  Called patient to inform her of appt, but no answer.  Left message on VM in regards to appt with date, time, address, and phone number.  Also, explained that patient can check Mychart for additional appointments within [facility].

## 2018-05-21 DIAGNOSIS — M5417 Radiculopathy, lumbosacral region: Secondary | ICD-10-CM | POA: Diagnosis not present

## 2018-05-21 DIAGNOSIS — M9905 Segmental and somatic dysfunction of pelvic region: Secondary | ICD-10-CM | POA: Diagnosis not present

## 2018-05-21 DIAGNOSIS — M9903 Segmental and somatic dysfunction of lumbar region: Secondary | ICD-10-CM | POA: Diagnosis not present

## 2018-05-21 DIAGNOSIS — M9902 Segmental and somatic dysfunction of thoracic region: Secondary | ICD-10-CM | POA: Diagnosis not present

## 2018-05-21 DIAGNOSIS — M5414 Radiculopathy, thoracic region: Secondary | ICD-10-CM | POA: Diagnosis not present

## 2018-05-21 DIAGNOSIS — M5386 Other specified dorsopathies, lumbar region: Secondary | ICD-10-CM | POA: Diagnosis not present

## 2018-05-22 ENCOUNTER — Other Ambulatory Visit: Payer: Self-pay | Admitting: Obstetrics and Gynecology

## 2018-05-22 ENCOUNTER — Ambulatory Visit (HOSPITAL_COMMUNITY): Payer: Medicaid Other

## 2018-05-22 ENCOUNTER — Ambulatory Visit (HOSPITAL_COMMUNITY)
Admission: RE | Admit: 2018-05-22 | Discharge: 2018-05-22 | Disposition: A | Discharge status: Home / Self Care | Payer: Medicaid Other | Source: Ambulatory Visit | Attending: Obstetrics and Gynecology | Admitting: Obstetrics and Gynecology

## 2018-05-22 ENCOUNTER — Other Ambulatory Visit: Payer: Self-pay

## 2018-05-22 ENCOUNTER — Ambulatory Visit (HOSPITAL_COMMUNITY): Payer: Medicaid Other | Admitting: *Deleted

## 2018-05-22 ENCOUNTER — Encounter (HOSPITAL_COMMUNITY): Payer: Self-pay

## 2018-05-22 VITALS — BP 120/82 | HR 96 | Temp 98.4°F | Wt 182.4 lb

## 2018-05-22 DIAGNOSIS — Z148 Genetic carrier of other disease: Secondary | ICD-10-CM | POA: Diagnosis not present

## 2018-05-22 DIAGNOSIS — O285 Abnormal chromosomal and genetic finding on antenatal screening of mother: Secondary | ICD-10-CM | POA: Diagnosis not present

## 2018-05-22 DIAGNOSIS — O289 Unspecified abnormal findings on antenatal screening of mother: Secondary | ICD-10-CM | POA: Diagnosis not present

## 2018-05-22 DIAGNOSIS — Z3686 Encounter for antenatal screening for cervical length: Secondary | ICD-10-CM

## 2018-05-22 DIAGNOSIS — O0990 Supervision of high risk pregnancy, unspecified, unspecified trimester: Secondary | ICD-10-CM | POA: Insufficient documentation

## 2018-05-22 DIAGNOSIS — Z3A19 19 weeks gestation of pregnancy: Secondary | ICD-10-CM

## 2018-05-22 DIAGNOSIS — Z363 Encounter for antenatal screening for malformations: Secondary | ICD-10-CM

## 2018-05-25 ENCOUNTER — Other Ambulatory Visit (HOSPITAL_COMMUNITY): Payer: Self-pay | Admitting: *Deleted

## 2018-05-25 DIAGNOSIS — Q969 Turner's syndrome, unspecified: Secondary | ICD-10-CM

## 2018-05-29 ENCOUNTER — Encounter: Payer: Self-pay | Admitting: Family Medicine

## 2018-05-29 ENCOUNTER — Ambulatory Visit (HOSPITAL_COMMUNITY): Payer: Self-pay | Admitting: Obstetrics and Gynecology

## 2018-05-29 DIAGNOSIS — D573 Sickle-cell trait: Secondary | ICD-10-CM | POA: Insufficient documentation

## 2018-05-29 DIAGNOSIS — D563 Thalassemia minor: Secondary | ICD-10-CM | POA: Insufficient documentation

## 2018-06-02 ENCOUNTER — Telehealth: Payer: Self-pay | Admitting: Student

## 2018-06-02 NOTE — Telephone Encounter (Signed)
Informed the patient of the patient reminders being sent to her address. She also stated she does not have access to blood press cuffs, will come this week to come pick up a pair from the nurse. Informed the patient she does not have to come in, if she will drive to the front door will give them to her.

## 2018-06-02 NOTE — Telephone Encounter (Signed)
28 week labs are scheduled, 4 weeks (virtual) scheduled, 8 week ob appointment is placed on the wait list. Called the patient concerning the updated appointments and the blood pressure cuffs.

## 2018-06-02 NOTE — Telephone Encounter (Signed)
postpone x 4 wks--virtual then, 8 wks, in person with 28 wk labsTransfer from REN;HOB -- notes per Shawnie Pons.

## 2018-06-03 ENCOUNTER — Encounter: Payer: Self-pay | Admitting: Family Medicine

## 2018-06-07 ENCOUNTER — Other Ambulatory Visit: Payer: Self-pay

## 2018-06-07 ENCOUNTER — Inpatient Hospital Stay (HOSPITAL_COMMUNITY)
Admission: AD | Admit: 2018-06-07 | Discharge: 2018-06-07 | Disposition: A | Discharge status: Home / Self Care | Payer: Medicaid Other | Attending: Obstetrics & Gynecology | Admitting: Obstetrics & Gynecology

## 2018-06-07 ENCOUNTER — Encounter (HOSPITAL_COMMUNITY): Payer: Self-pay | Admitting: Student

## 2018-06-07 DIAGNOSIS — R102 Pelvic and perineal pain: Secondary | ICD-10-CM | POA: Diagnosis not present

## 2018-06-07 DIAGNOSIS — O9989 Other specified diseases and conditions complicating pregnancy, childbirth and the puerperium: Secondary | ICD-10-CM | POA: Insufficient documentation

## 2018-06-07 DIAGNOSIS — O26892 Other specified pregnancy related conditions, second trimester: Secondary | ICD-10-CM | POA: Diagnosis not present

## 2018-06-07 DIAGNOSIS — Z3A22 22 weeks gestation of pregnancy: Secondary | ICD-10-CM | POA: Diagnosis not present

## 2018-06-07 DIAGNOSIS — Z91013 Allergy to seafood: Secondary | ICD-10-CM | POA: Diagnosis not present

## 2018-06-07 LAB — URINALYSIS, ROUTINE W REFLEX MICROSCOPIC
Bilirubin Urine: NEGATIVE
Glucose, UA: NEGATIVE mg/dL
Hgb urine dipstick: NEGATIVE
Ketones, ur: NEGATIVE mg/dL
Leukocytes,Ua: NEGATIVE
Nitrite: NEGATIVE
Protein, ur: NEGATIVE mg/dL
Specific Gravity, Urine: 1.009 (ref 1.005–1.030)
pH: 8 (ref 5.0–8.0)

## 2018-06-07 NOTE — MAU Note (Signed)
Joanna Henry is a 23 y.o. at [redacted]w[redacted]d here in MAU reporting: vaginal pain since last night, states the pain is intermittent and is worse when shes up moving. No vaginal bleeding or discharge. Has felt some movement  Onset of complaint: last night  Pain score: 8/10  Vitals:   06/07/18 1208  BP: 115/78  Pulse: (!) 101  Resp: 18  Temp: 98.3 F (36.8 C)  SpO2: 98%      FHT:163  Lab orders placed from triage: UA

## 2018-06-07 NOTE — MAU Provider Note (Signed)
Chief Complaint: Vaginal Pain   First Provider Initiated Contact with Patient 06/07/18 1242     SUBJECTIVE HPI: Joanna Henry is a 23 y.o. G1P0 at [redacted]w[redacted]d who presents to [facility] Admissions reporting vaginal pain. Symptoms started this morning. Woke up with intermittent back pain & vaginal pain. Back pain has since resolved but continues to have discomfort in vagina. States its feels like throbbing or pulsing. Denies dysuria, vaginal bleeding, LOF, vaginal discharge, vaginal irritation, or recent intercourse.  Currently no pain.   Past Medical History:  Diagnosis Date  . ADHD (attention deficit hyperactivity disorder)    OB History  Gravida Para Term Preterm AB Living  1            SAB TAB Ectopic Multiple Live Births               # Outcome Date GA Lbr Len/2nd Weight Sex Delivery Anes PTL Lv  1 Current            Past Surgical History:  Procedure Laterality Date  . NO PAST SURGERIES     Social History   Socioeconomic History  . Marital status: Single    Spouse name: Not on file  . Number of children: Not on file  . Years of education: 12th grade  . Highest education level: 12th grade  Occupational History    Employer: [employer]  Social Needs  . Financial resource strain: Very hard  . Food insecurity:    Worry: Often true    Inability: Often true  . Transportation needs:    Medical: Yes    Non-medical: Yes  Tobacco Use  . Smoking status: Never Smoker  . Smokeless tobacco: Never Used  Substance and Sexual Activity  . Alcohol use: No  . Drug use: Not Currently    Types: Marijuana    Comment: daily when unable to eat  . Sexual activity: Yes    Birth control/protection: None    Comment: Skin irrigation from the patch  Lifestyle  . Physical activity:    Days per week: 5 days    Minutes per session: 30 min  . Stress: Only a little  Relationships  . Social connections:    Talks on phone: More than three times a week    Gets together: Once a week    Attends  religious service: 1 to 4 times per year    Active member of club or organization: No    Attends meetings of clubs or organizations: Never    Relationship status: Never married  . Intimate partner violence:    Fear of current or ex partner: No    Emotionally abused: No    Physically abused: No    Forced sexual activity: No  Other Topics Concern  . Not on file  Social History Narrative   ** Merged History Encounter **   Patient advised to get an appointment with [facility] and social services to apply for food stamps, set up medical transportation and [facility].       History reviewed. No pertinent family history. No current facility-administered medications on file prior to encounter.    Current Outpatient Medications on File Prior to Encounter  Medication Sig Dispense Refill  . Prenatal Vit-Fe Fumarate-FA (PRENATAL VITAMIN PO) Take by mouth.     Allergies  Allergen Reactions  . Fish Allergy Nausea And Vomiting    I have reviewed patient's Past Medical Hx, Surgical Hx, Family Hx, Social Hx, medications and allergies.   Review of  Systems  Constitutional: Negative.   Gastrointestinal: Positive for nausea and vomiting. Negative for abdominal pain, constipation and diarrhea.  Genitourinary: Positive for vaginal pain. Negative for dysuria, vaginal bleeding and vaginal discharge.    OBJECTIVE Patient Vitals for the past 24 hrs:  BP Temp Temp src Pulse Resp SpO2 Height Weight  06/07/18 1330 113/72 - - - - - - -  06/07/18 1208 115/78 98.3 F (36.8 C) Oral (!) 101 18 98 % - -  06/07/18 1202 - - - - - - 5\' 8"  (1.727 m) 84 kg   Constitutional: Well-developed, well-nourished female in no acute distress.  Cardiovascular: normal rate & rhythm, no murmur Respiratory: normal rate and effort. Lung sounds clear throughout GI: Abd soft, non-tender, Pos BS x 4. No guarding or rebound tenderness MS: Extremities nontender, no edema, normal ROM Neurologic: Alert and oriented x 4.  GU: NEFG, no blood.  Cervix closed/thick   LAB RESULTS Results for orders placed or performed during the hospital encounter of 06/07/18 (from the past 24 hour(s))  Urinalysis, Routine w reflex microscopic     Status: Abnormal   Collection Time: 06/07/18 12:40 PM  Result Value Ref Range   Color, Urine YELLOW YELLOW   APPearance HAZY (A) CLEAR   Specific Gravity, Urine 1.009 1.005 - 1.030   pH 8.0 5.0 - 8.0   Glucose, UA NEGATIVE NEGATIVE mg/dL   Hgb urine dipstick NEGATIVE NEGATIVE   Bilirubin Urine NEGATIVE NEGATIVE   Ketones, ur NEGATIVE NEGATIVE mg/dL   Protein, ur NEGATIVE NEGATIVE mg/dL   Nitrite NEGATIVE NEGATIVE   Leukocytes,Ua NEGATIVE NEGATIVE    IMAGING No results found.  MAU COURSE Orders Placed This Encounter  Procedures  . Urinalysis, Routine w reflex microscopic  . Discharge patient   No orders of the defined types were placed in this encounter.   MDM FHT present via doppler No vulva lesions, swelling, or irritation. No blood. Cervix closed/thick U/a negative for infection  ASSESSMENT 1. Pelvic pain affecting pregnancy in second trimester, antepartum   2. [redacted] weeks gestation of pregnancy     PLAN Discharge home in stable condition. PTL precautions  Allergies as of 06/07/2018      Reactions   Fish Allergy Nausea And Vomiting      Medication List    STOP taking these medications   ondansetron 4 MG disintegrating tablet Commonly known as:  Zofran ODT     TAKE these medications   PRENATAL VITAMIN PO Take by mouth.        Judeth Horn, NP 06/07/2018  4:20 PM

## 2018-06-07 NOTE — Discharge Instructions (Signed)
Warning Signs During Pregnancy  A pregnancy lasts about 40 weeks, starting from the first day of your last period until the baby is born. Pregnancy is divided into three phases called trimesters.  · The first trimester refers to week 1 through week 13 of pregnancy.  · The second trimester is the start of week 14 through the end of week 27.  · The third trimester is the start of week 28 until you deliver your baby.  During each trimester of pregnancy, certain signs and symptoms may indicate a problem. Talk with your health care provider about your current health and any medical conditions you have. Make sure you know the symptoms that you should watch for and report.  How does this affect me?    Warning signs in the first trimester  While some changes during the first trimester may be uncomfortable, most do not represent a serious problem. Let your health care provider know if you have any of the following warning signs in the first trimester:  · You cannot eat or drink without vomiting, and this lasts for longer than a day.  · You have vaginal bleeding or spotting along with menstrual-like cramping.  · You have diarrhea for longer than a day.  · You have a fever or other signs of infection, such as:  ? Pain or burning when you urinate.  ? Foul smelling or thick or yellowish vaginal discharge.  Warning signs in the second trimester  As your baby grows and changes during the second trimester, there are additional signs and symptoms that may indicate a problem. These include:  · Signs and symptoms of infection, including a fever.  · Signs or symptoms of a miscarriage or preterm labor, such as regular contractions, menstrual-like cramping, or lower abdominal pain.  · Bloody or watery vaginal discharge or obvious vaginal bleeding.  · Feeling like your heart is pounding.  · Having trouble breathing.  · Nausea, vomiting, or diarrhea that lasts for longer than a day.  · Craving non-food items, such as clay, chalk, or dirt.  This may be a sign of a very treatable medical condition called pica.  Later in your second trimester, watch for signs and symptoms of a serious medical condition called preeclampsia.These include:  · Changes in your vision.  · A severe headache that does not go away.  · Nausea and vomiting.  It is also important to notice if your baby stops moving or moves less than usual during this time.  Warning signs in the third trimester  As you approach the third trimester, your baby is growing and your body is preparing for the birth of your baby. In your third trimester, be sure to let your health care provider know if:  · You have signs and symptoms of infection, including a fever.  · You have vaginal bleeding.  · You notice that your baby is moving less than usual or is not moving.  · You have nausea, vomiting, or diarrhea that lasts for longer than a day.  · You have a severe headache that does not go away.  · You have vision changes, including seeing spots or having blurry or double vision.  · You have increased swelling in your hands or face.  How does this affect my baby?  Throughout your pregnancy, always report any of the warning signs of a problem to your health care provider. This can help prevent complications that may affect your baby, including:  · Increased risk   for premature birth.  · Infection that may be transmitted to your baby.  · Increased risk for stillbirth.  Contact a health care provider if:  · You have any of the warning signs of a problem for the current trimester of your pregnancy.  · Any of the following apply to you during any trimester of pregnancy:  ? You have strong emotions, such as sadness or anxiety, that interfere with work or personal relationships.  ? You feel unsafe in your home and need help finding a safe place to live.  ? You are using tobacco products, alcohol, or drugs and you need help to stop.  Get help right away if:  You have signs or symptoms of labor before 37 weeks of  pregnancy. These include:  · Contractions that are 5 minutes or less apart, or that increase in frequency, intensity, or length.  · Sudden, sharp abdominal pain or low back pain.  · Uncontrolled gush or trickle of fluid from your vagina.  Summary  · A pregnancy lasts about 40 weeks, starting from the first day of your last period until the baby is born. Pregnancy is divided into three phases called trimesters. Each trimester has warning signs to watch for.  · Always report any warning signs to your health care provider in order to prevent complications that may affect both you and your baby.  · Talk with your health care provider about your current health and any medical conditions you have. Make sure you know the symptoms that you should watch for and report.  This information is not intended to replace advice given to you by your health care provider. Make sure you discuss any questions you have with your health care provider.  Document Released: 12/05/2016 Document Revised: 12/05/2016 Document Reviewed: 12/05/2016  Elsevier Interactive Patient Education © 2019 Elsevier Inc.

## 2018-06-30 DIAGNOSIS — Z3A25 25 weeks gestation of pregnancy: Secondary | ICD-10-CM | POA: Diagnosis not present

## 2018-06-30 DIAGNOSIS — O351XX0 Maternal care for (suspected) chromosomal abnormality in fetus, not applicable or unspecified: Secondary | ICD-10-CM | POA: Diagnosis not present

## 2018-07-01 ENCOUNTER — Ambulatory Visit (INDEPENDENT_AMBULATORY_CARE_PROVIDER_SITE_OTHER): Payer: Medicaid Other | Admitting: Obstetrics and Gynecology

## 2018-07-01 ENCOUNTER — Other Ambulatory Visit: Payer: Self-pay

## 2018-07-01 ENCOUNTER — Telehealth: Payer: Self-pay | Admitting: Medical

## 2018-07-01 DIAGNOSIS — D563 Thalassemia minor: Secondary | ICD-10-CM | POA: Diagnosis not present

## 2018-07-01 DIAGNOSIS — D573 Sickle-cell trait: Secondary | ICD-10-CM

## 2018-07-01 DIAGNOSIS — O0992 Supervision of high risk pregnancy, unspecified, second trimester: Secondary | ICD-10-CM | POA: Diagnosis not present

## 2018-07-01 DIAGNOSIS — K219 Gastro-esophageal reflux disease without esophagitis: Secondary | ICD-10-CM

## 2018-07-01 DIAGNOSIS — O285 Abnormal chromosomal and genetic finding on antenatal screening of mother: Secondary | ICD-10-CM

## 2018-07-01 DIAGNOSIS — Z3A25 25 weeks gestation of pregnancy: Secondary | ICD-10-CM | POA: Diagnosis not present

## 2018-07-01 DIAGNOSIS — O0990 Supervision of high risk pregnancy, unspecified, unspecified trimester: Secondary | ICD-10-CM

## 2018-07-01 MED ORDER — PANTOPRAZOLE SODIUM 20 MG PO TBEC: 20.0000 mg | DELAYED_RELEASE_TABLET | Freq: Every day | ORAL | 5 refills | Status: DC

## 2018-07-01 NOTE — Progress Notes (Signed)
   TELEHEALTH VIRTUAL OBSTETRICS VISIT ENCOUNTER NOTE  I connected with Joanna Henry on 07/01/18 at  3:55 PM EDT by telephone at home and verified that I am speaking with the correct person using two identifiers.   I discussed the limitations, risks, security and privacy concerns of performing an evaluation and management service by telephone and the availability of in person appointments. I also discussed with the patient that there may be a patient responsible charge related to this service. The patient expressed understanding and agreed to proceed.  Subjective:  Joanna Henry is a 23 y.o. G1P0 at [redacted]w[redacted]d being followed for ongoing prenatal care.  She is currently monitored for the following issues for this high-risk pregnancy and has Adjustment disorder with mixed disturbance of emotions and conduct; Supervision of high risk pregnancy, antepartum; Sickle cell trait (HCC); Alpha thalassemia silent carrier; and High risk for Monosomy X (per Panorama) on their problem list.  Patient reports no complaints. Reports fetal movement. Denies any contractions, bleeding or leaking of fluid.   The following portions of the patient's history were reviewed and updated as appropriate: allergies, current medications, past family history, past medical history, past social history, past surgical history and problem list.   Objective:  There were no vitals filed for this visit.  General:  Alert, oriented and cooperative.   Mental Status: Normal mood and affect perceived. Normal judgment and thought content.  Rest of physical exam deferred due to type of encounter  Assessment and Plan:  Pregnancy: G1P0 at [redacted]w[redacted]d 1. Abnormal fetal chromosomal analysis affecting antepartum care of mother F/u mfm recs after 5/15 u/s in terms of ap testing and delivery planning -s/p GC consult (declined amnio) -fetal echo neg. Recommend pp neonatal echo  2. Supervision of high risk pregnancy, antepartum 28wk labs nv  3. Sickle  cell trait (HCC) ucx needed qtrimester.  4. Alpha thalassemia silent carrier  5. Gastroesophageal reflux disease without esophagitis ppi sent in  Preterm labor symptoms and general obstetric precautions including but not limited to vaginal bleeding, contractions, leaking of fluid and fetal movement were reviewed in detail with the patient.  I discussed the assessment and treatment plan with the patient. The patient was provided an opportunity to ask questions and all were answered. The patient agreed with the plan and demonstrated an understanding of the instructions. The patient was advised to call back or seek an in-person office evaluation/go to MAU at [facility] for any urgent or concerning symptoms. Please refer to After Visit Summary for other counseling recommendations.   I provided 15 minutes of non-face-to-face time during this encounter. The visit was conducted via phone encounter; her phone was unable to do Webex  Return in about 16 days (around 07/17/2018).  Future Appointments  Date Time Provider Department Center  07/17/2018 10:30 AM WH-MFC NURSE WH-MFC MFC-US  07/17/2018 10:30 AM WH-MFC [place] 1 WH-MFCUS MFC-US  07/23/2018  8:20 AM [facility]-WOCA LAB WOC-W[facility]A WOC    [place] Bing, MD Center for [employer], [facility]

## 2018-07-01 NOTE — Telephone Encounter (Signed)
The patient called in regarding her appointment. Informed of the COVID19 restrictions as the patient wanted to come in to clinic. She stated the camera is messed up on her phone and she would rather complete a telephone visit.

## 2018-07-02 NOTE — Progress Notes (Signed)
Pt unable to do virtual visit. Pt phone disconnects call at random times. Pt was able to use a friend's phone to have a telephone visit. Unable to download app on friend phone due to camera being broken.

## 2018-07-17 ENCOUNTER — Encounter (HOSPITAL_COMMUNITY): Payer: Self-pay

## 2018-07-17 ENCOUNTER — Ambulatory Visit (HOSPITAL_COMMUNITY): Payer: Medicaid Other | Admitting: *Deleted

## 2018-07-17 ENCOUNTER — Other Ambulatory Visit (HOSPITAL_COMMUNITY): Payer: Self-pay | Admitting: *Deleted

## 2018-07-17 ENCOUNTER — Ambulatory Visit (HOSPITAL_COMMUNITY)
Admission: RE | Admit: 2018-07-17 | Discharge: 2018-07-17 | Disposition: A | Discharge status: Home / Self Care | Payer: Medicaid Other | Source: Ambulatory Visit | Attending: Obstetrics and Gynecology | Admitting: Obstetrics and Gynecology

## 2018-07-17 ENCOUNTER — Other Ambulatory Visit: Payer: Self-pay

## 2018-07-17 VITALS — BP 118/70 | HR 84 | Temp 98.3°F

## 2018-07-17 DIAGNOSIS — O289 Unspecified abnormal findings on antenatal screening of mother: Secondary | ICD-10-CM | POA: Diagnosis not present

## 2018-07-17 DIAGNOSIS — Z362 Encounter for other antenatal screening follow-up: Secondary | ICD-10-CM

## 2018-07-17 DIAGNOSIS — Q969 Turner's syndrome, unspecified: Secondary | ICD-10-CM | POA: Diagnosis not present

## 2018-07-17 DIAGNOSIS — Z148 Genetic carrier of other disease: Secondary | ICD-10-CM

## 2018-07-17 DIAGNOSIS — Z3A27 27 weeks gestation of pregnancy: Secondary | ICD-10-CM

## 2018-07-17 DIAGNOSIS — O0990 Supervision of high risk pregnancy, unspecified, unspecified trimester: Secondary | ICD-10-CM

## 2018-07-17 DIAGNOSIS — O0993 Supervision of high risk pregnancy, unspecified, third trimester: Secondary | ICD-10-CM | POA: Insufficient documentation

## 2018-07-23 ENCOUNTER — Other Ambulatory Visit: Payer: Self-pay

## 2018-07-23 ENCOUNTER — Other Ambulatory Visit: Payer: Self-pay | Admitting: General Practice

## 2018-07-23 DIAGNOSIS — O0990 Supervision of high risk pregnancy, unspecified, unspecified trimester: Secondary | ICD-10-CM

## 2018-07-27 ENCOUNTER — Inpatient Hospital Stay (HOSPITAL_COMMUNITY)
Admission: AD | Admit: 2018-07-27 | Discharge: 2018-07-27 | Disposition: A | Discharge status: Home / Self Care | Payer: Medicaid Other | Attending: Obstetrics & Gynecology | Admitting: Obstetrics & Gynecology

## 2018-07-27 ENCOUNTER — Other Ambulatory Visit: Payer: Self-pay

## 2018-07-27 ENCOUNTER — Encounter (HOSPITAL_COMMUNITY): Payer: Self-pay

## 2018-07-27 DIAGNOSIS — Z3A29 29 weeks gestation of pregnancy: Secondary | ICD-10-CM | POA: Diagnosis not present

## 2018-07-27 DIAGNOSIS — O26893 Other specified pregnancy related conditions, third trimester: Secondary | ICD-10-CM | POA: Insufficient documentation

## 2018-07-27 DIAGNOSIS — M549 Dorsalgia, unspecified: Secondary | ICD-10-CM

## 2018-07-27 DIAGNOSIS — O9989 Other specified diseases and conditions complicating pregnancy, childbirth and the puerperium: Secondary | ICD-10-CM

## 2018-07-27 DIAGNOSIS — M545 Low back pain: Secondary | ICD-10-CM | POA: Diagnosis not present

## 2018-07-27 DIAGNOSIS — O99891 Other specified diseases and conditions complicating pregnancy: Secondary | ICD-10-CM

## 2018-07-27 DIAGNOSIS — E162 Hypoglycemia, unspecified: Secondary | ICD-10-CM | POA: Diagnosis not present

## 2018-07-27 DIAGNOSIS — M5489 Other dorsalgia: Secondary | ICD-10-CM | POA: Diagnosis not present

## 2018-07-27 DIAGNOSIS — R52 Pain, unspecified: Secondary | ICD-10-CM | POA: Diagnosis not present

## 2018-07-27 DIAGNOSIS — I959 Hypotension, unspecified: Secondary | ICD-10-CM | POA: Diagnosis not present

## 2018-07-27 DIAGNOSIS — E161 Other hypoglycemia: Secondary | ICD-10-CM | POA: Diagnosis not present

## 2018-07-27 LAB — URINALYSIS, ROUTINE W REFLEX MICROSCOPIC
Bilirubin Urine: NEGATIVE
Glucose, UA: NEGATIVE mg/dL
Hgb urine dipstick: NEGATIVE
Ketones, ur: NEGATIVE mg/dL
Nitrite: NEGATIVE
Protein, ur: NEGATIVE mg/dL
Specific Gravity, Urine: 1.012 (ref 1.005–1.030)
pH: 7 (ref 5.0–8.0)

## 2018-07-27 NOTE — MAU Note (Signed)
Pt reports to mau via EMS with c/o lower left back pain that started while at work a few hours ago.  Pt denies abd pain, vag bleeding or LOF and reports good fetal movement.

## 2018-07-27 NOTE — Discharge Instructions (Signed)
Back Pain in Pregnancy  Back pain during pregnancy is common. Back pain may be caused by several factors that are related to changes during your pregnancy.  Follow these instructions at home:  Managing pain, stiffness, and swelling          If directed, for sudden (acute) back pain, put ice on the painful area.  ? Put ice in a plastic bag.  ? Place a towel between your skin and the bag.  ? Leave the ice on for 20 minutes, 2-3 times per day.   If directed, apply heat to the affected area before you exercise. Use the heat source that your health care provider recommends, such as a moist heat pack or a heating pad.  ? Place a towel between your skin and the heat source.  ? Leave the heat on for 20-30 minutes.  ? Remove the heat if your skin turns bright red. This is especially important if you are unable to feel pain, heat, or cold. You may have a greater risk of getting burned.   If directed, massage the affected area.  Activity   Exercise as told by your health care provider. Gentle exercise is the best way to prevent or manage back pain.   Listen to your body when lifting. If lifting hurts, ask for help or bend your knees. This uses your leg muscles instead of your back muscles.   Squat down when picking up something from the floor. Do not bend over.   Only use bed rest for short periods as told by your health care provider. Bed rest should only be used for the most severe episodes of back pain.  Standing, sitting, and lying down   Do not stand in one place for long periods of time.   Use good posture when sitting. Make sure your head rests over your shoulders and is not hanging forward. Use a pillow on your lower back if necessary.   Try sleeping on your side, preferably the left side, with a pregnancy support pillow or 1-2 regular pillows between your legs.  ? If you have back pain after a night's rest, your bed may be too soft.  ? A firm mattress may provide more support for your back during  pregnancy.  General instructions   Do not wear high heels.   Eat a healthy diet. Try to gain weight within your health care provider's recommendations.   Use a maternity girdle, elastic sling, or back brace as told by your health care provider.   Take over-the-counter and prescription medicines only as told by your health care provider.   Work with a physical therapist or massage therapist to find ways to manage back pain. Acupuncture or massage therapy may be helpful.   Keep all follow-up visits as told by your health care provider. This is important.  Contact a health care provider if:   Your back pain interferes with your daily activities.   You have increasing pain in other parts of your body.  Get help right away if:   You develop numbness, tingling, weakness, or problems with the use of your arms or legs.   You develop severe back pain that is not controlled with medicine.   You have a change in bowel or bladder control.   You develop shortness of breath, dizziness, or you faint.   You develop nausea, vomiting, or sweating.   You have back pain that is a rhythmic, cramping pain similar to labor pains. Labor   pain is usually 1-2 minutes apart, lasts for about 1 minute, and involves a bearing down feeling or pressure in your pelvis.   You have back pain and your water breaks or you have vaginal bleeding.   You have back pain or numbness that travels down your leg.   Your back pain developed after you fell.   You develop pain on one side of your back.   You see blood in your urine.   You develop skin blisters in the area of your back pain.  Summary   Back pain may be caused by several factors that are related to changes during your pregnancy.   Follow instructions as told by your health care provider for managing pain, stiffness, and swelling.   Exercise as told by your health care provider. Gentle exercise is the best way to prevent or manage back pain.   Take over-the-counter and  prescription medicines only as told by your health care provider.   Keep all follow-up visits as told by your health care provider. This is important.  This information is not intended to replace advice given to you by your health care provider. Make sure you discuss any questions you have with your health care provider.  Document Released: 05/29/2005 Document Revised: 08/06/2017 Document Reviewed: 08/06/2017  Elsevier Interactive Patient Education  2019 Elsevier Inc.

## 2018-07-27 NOTE — MAU Provider Note (Signed)
History     CSN: [id number]  Arrival date and time: 07/27/18 1335   First Provider Initiated Contact with Patient 07/27/18 1406      Chief Complaint  Patient presents with  . Back Pain   HPI   [name] is a 23 y.o. G1P0 @ 7566w1d here in MAU with lower back pain that started while she was working today. 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 Then it would come and go for a while. She then called 911 because she was concerned about having contractions. She denies pain at this time. No bleeding. + fetal movement.   OB History    Gravida  1   Para      Term      Preterm      AB      Living        SAB      TAB      Ectopic      Multiple      Live Births              Past Medical History:  Diagnosis Date  . ADHD (attention deficit hyperactivity disorder)     Past Surgical History:  Procedure Laterality Date  . NO PAST SURGERIES      History reviewed. No pertinent family history.  Social History   Tobacco Use  . Smoking status: Never Smoker  . Smokeless tobacco: Never Used  Substance Use Topics  . Alcohol use: No  . Drug use: Not Currently    Types: Marijuana    Comment: daily when unable to eat    Allergies:  Allergies  Allergen Reactions  . Fish Allergy Nausea And Vomiting    Medications Prior to Admission  Medication Sig Dispense Refill Last Dose  . pantoprazole (PROTONIX) 20 MG tablet Take 1 tablet (20 mg total) by mouth daily. 30 tablet 5 Taking  . Prenatal Vit-Fe Fumarate-FA (PRENATAL VITAMIN PO) Take by mouth.   Taking   Results for orders placed or performed during the hospital encounter of 07/27/18 (from the past 48 hour(s))  Urinalysis, Routine w reflex microscopic     Status: Abnormal   Collection Time: 07/27/18  1:57 PM  Result Value Ref Range   Color, Urine YELLOW YELLOW   APPearance CLEAR CLEAR   Specific Gravity, Urine 1.012  1.005 - 1.030   pH 7.0 5.0 - 8.0   Glucose, UA NEGATIVE NEGATIVE mg/dL   Hgb urine dipstick NEGATIVE NEGATIVE   Bilirubin Urine NEGATIVE NEGATIVE   Ketones, ur NEGATIVE NEGATIVE mg/dL   Protein, ur NEGATIVE NEGATIVE mg/dL   Nitrite NEGATIVE NEGATIVE   Leukocytes,Ua MODERATE (A) NEGATIVE   RBC / HPF 0-5 0 - 5 RBC/hpf   WBC, UA 6-10 0 - 5 WBC/hpf   Bacteria, UA MANY (A) NONE SEEN   Squamous Epithelial / LPF 6-10 0 - 5    Comment: Performed at [facility], [address] [place], [place] [address]   Review of Systems  Constitutional: Negative for fever.  Genitourinary: Negative for decreased urine volume, dysuria and vaginal bleeding.  Musculoskeletal: Positive for back pain.   Physical Exam   Blood pressure 103/65, pulse 87, temperature 98.2 F (36.8 C), temperature source Oral, resp. rate 18, last menstrual period 01/16/2018, SpO2 99 %.  Physical Exam  Constitutional: She is oriented to person, place, and time. She appears  well-developed and well-nourished. No distress.  HENT:  Head: Normocephalic.  GI: Soft. She exhibits no distension and no mass. There is no abdominal tenderness. There is no rebound, no guarding and no CVA tenderness.  Musculoskeletal: Normal range of motion.  Neurological: She is alert and oriented to person, place, and time.  Skin: Skin is warm. She is not diaphoretic.  Psychiatric: Her behavior is normal.   Fetal Tracing: Baseline: 140 bpm Variability: Moderate  Accelerations: 10x10 Decelerations: Variable  Toco: None  MAU Course  Procedures  None  MDM  Patient declined cervical exam UA with urine culture.  Patient not having pain at this time, no CVA tenderness.    Assessment and Plan   A:  1. Back pain in pregnancy   2. [redacted] weeks gestation of pregnancy     P:  Discharge home in stable condition Urine culture pending, no CVA tenderness Return to MAU if symptoms worsen Increase oral fluid intake   , Harolyn Rutherford,  NP 07/27/2018 4:57 PM

## 2018-07-28 ENCOUNTER — Other Ambulatory Visit: Payer: Medicaid Other

## 2018-07-28 ENCOUNTER — Other Ambulatory Visit: Payer: Self-pay | Admitting: Obstetrics and Gynecology

## 2018-07-28 ENCOUNTER — Encounter: Payer: Self-pay | Admitting: *Deleted

## 2018-07-28 DIAGNOSIS — O0990 Supervision of high risk pregnancy, unspecified, unspecified trimester: Secondary | ICD-10-CM

## 2018-07-28 MED ORDER — ACCU-CHEK GUIDE W/DEVICE KIT: 1.0000 [IU] | PACK | Freq: Four times a day (QID) | 0 refills | Status: DC

## 2018-07-28 MED ORDER — GLUCOSE BLOOD VI STRP: ORAL_STRIP | 12 refills | Status: DC

## 2018-07-28 MED ORDER — ACCU-CHEK FASTCLIX LANCETS MISC: 1.0000 [IU] | Freq: Four times a day (QID) | 12 refills | Status: DC

## 2018-07-28 NOTE — Progress Notes (Unsigned)
Pt refused all 28 week labs.  Per Dr. Earlene Plater, if pt declines 2 hr gtt then she will have to check her blood sugar 4 times daily.  Pt stated she would rather do that. Dr. Earlene Plater made aware.

## 2018-07-28 NOTE — Progress Notes (Signed)
Patient declined 2 hr GTT, will check CBGs 4x daily, has been referred to DM education, equipment sent to pharmacy.

## 2018-07-29 ENCOUNTER — Telehealth: Payer: Self-pay | Admitting: *Deleted

## 2018-07-29 ENCOUNTER — Encounter: Payer: Self-pay | Admitting: *Deleted

## 2018-07-29 ENCOUNTER — Encounter: Payer: Self-pay | Admitting: Obstetrics & Gynecology

## 2018-07-29 DIAGNOSIS — R8271 Bacteriuria: Secondary | ICD-10-CM | POA: Insufficient documentation

## 2018-07-29 LAB — CULTURE, OB URINE
Culture: 10000 — AB
Special Requests: NORMAL

## 2018-07-29 NOTE — Telephone Encounter (Addendum)
Culture showed only 10K colonies, this is not a UTI but it is bacteruria. No treatment needed now,  just needs prophylaxis in labor.  Problem list updated.  Jaynie Collins, MD

## 2018-07-29 NOTE — Telephone Encounter (Signed)
Received call from [facility] lab + GBS in urine - will forward to provider.

## 2018-07-30 ENCOUNTER — Other Ambulatory Visit: Payer: Self-pay

## 2018-07-30 ENCOUNTER — Encounter: Payer: Medicaid Other | Attending: Obstetrics and Gynecology | Admitting: *Deleted

## 2018-07-30 ENCOUNTER — Ambulatory Visit: Payer: Medicaid Other | Admitting: *Deleted

## 2018-07-30 DIAGNOSIS — O24419 Gestational diabetes mellitus in pregnancy, unspecified control: Secondary | ICD-10-CM | POA: Diagnosis not present

## 2018-07-30 DIAGNOSIS — Z3A00 Weeks of gestation of pregnancy not specified: Secondary | ICD-10-CM | POA: Diagnosis not present

## 2018-07-30 DIAGNOSIS — Z713 Dietary counseling and surveillance: Secondary | ICD-10-CM | POA: Insufficient documentation

## 2018-07-30 NOTE — Progress Notes (Signed)
  Patient was seen on 07/30/2018 for instruction on how to use the Accu Chek Guide meter. She declined the GTT so she is instructed to test her BG instead. EDD 10/11/2018. Patient states no history of GDM. Diet history obtained. Patient eats poor variety of all food groups with high carbohydrate and sugar foods and beverages routinely. She expresses interest in improving her eating habits. Beverages include water, sweet tea, .  The following learning objectives were met by the patient :   States the definition of Gestational Diabetes in general  States why dietary management is important in controlling blood glucose  Describes the effects of carbohydrates on blood glucose levels  Demonstrates ability to create a balanced meal plan  Demonstrates carbohydrate counting   States when to check blood glucose levels  Demonstrates proper blood glucose monitoring techniques  States the importance of limiting caffeine and abstaining from alcohol and smoking  Plan:  Aim for 3 Carb Choices per meal (45 grams) +/- 1 either way  Aim for 1-2 Carbs per snack Begin reading food labels for Total Carbohydrate of foods If OK with your MD, consider  increasing your activity level by walking, Arm Chair Exercises or other activity daily as tolerated Begin checking BG before breakfast and 2 hours after first bite of breakfast, lunch and dinner as directed by MD  Bring Log Book/Sheet and meter to every medical appointment OR use Baby Scripts (see below) Baby Scripts:  Patient was introduced to [employer] and plans to use as record of BG electronically  Take medication if directed by MD  Patient already has a meter: Accu Chek Guide BG today was 158 less than 1 hour after a very high carb breakfast (McGriddle sandwich with syrup, biscuit, hash browns and sweet tea) Patient instructed to test pre breakfast and 2 hours each meal as directed by MD  Patient instructed to monitor glucose levels: FBS: 60 - 95  mg/dl 2 hour: <120 mg/dl  Patient received the following handouts:  Nutrition Diabetes and Pregnancy  Carbohydrate Counting List  BG Log Sheet  Patient will be seen for follow-up as needed.

## 2018-08-04 ENCOUNTER — Telehealth: Payer: Self-pay | Admitting: Obstetrics & Gynecology

## 2018-08-04 NOTE — Telephone Encounter (Signed)
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 She stated her sugars were in the 300's, but has now come down to the 100's. I spoke with Addison Naegeli RN, and she stated the provider will address her readings at her visit tomorrow.

## 2018-08-05 ENCOUNTER — Other Ambulatory Visit: Payer: Self-pay

## 2018-08-05 ENCOUNTER — Telehealth: Payer: Self-pay | Admitting: Obstetrics & Gynecology

## 2018-08-05 ENCOUNTER — Telehealth (INDEPENDENT_AMBULATORY_CARE_PROVIDER_SITE_OTHER): Payer: Medicaid Other | Admitting: Obstetrics & Gynecology

## 2018-08-05 DIAGNOSIS — O0993 Supervision of high risk pregnancy, unspecified, third trimester: Secondary | ICD-10-CM | POA: Diagnosis not present

## 2018-08-05 DIAGNOSIS — Z3A30 30 weeks gestation of pregnancy: Secondary | ICD-10-CM

## 2018-08-05 DIAGNOSIS — O0990 Supervision of high risk pregnancy, unspecified, unspecified trimester: Secondary | ICD-10-CM

## 2018-08-05 MED ORDER — AMBULATORY NON FORMULARY MEDICATION: 1.0000 | 0 refills | Status: DC

## 2018-08-05 NOTE — Progress Notes (Addendum)
I connected with  Joanna Henry on 08/05/18 at  8:55 AM EDT by telephone and verified that I am speaking with the correct person using two identifiers.   I discussed the limitations, risks, security and privacy concerns of performing an evaluation and management service by telephone and virtually and the availability of in person appointments. I also discussed with the patient that there may be a patient responsible charge related to this service. The patient expressed understanding and agreed to proceed. States she cannot do MyChart because her camera is broke on her camera so consented to telephone visit.  States her glucose was 300 yesterday. States it is 96 this am fasting. States not sleeping well. Has not gotten bp cuff so cannot take bp- verified her address , which was wrongi in chart . Explained will mail bp cuff and take bp weekly.Call us if 140/90 or higher.  Yer Olivencia,RN No Blood glucoses in babyscripts  She states she thought blood glucoses were flowing into her chart so she has not been putting in babysripts and also that she can't download app yet because switching phones. She will log her cbg's. Her cbg's are :  07/30/18 fasting 158, 1pm 77, 6:34pm 94, hs 94 5/29//20 fasting 7am 88, 1123 am 102, 4pm 121 08/01/18 fasting 94, 12pm 87, 4:35pm 137, 9:15 pm 101 08/02/18 fasting 91, 7pm 125, 08/03/18 fasting 10:30 am 163, 2pm 132, 8pm 166 08/03/08 fasting 84, 2:11pm 301, 2:40pm 140, 9pm 93 08/05/18 fasting 96 08/05/2018  8:44 AM

## 2018-08-05 NOTE — Addendum Note (Signed)
Addended by: Gerome Apley on: 08/05/2018 09:40 AM   Modules accepted: Orders

## 2018-08-05 NOTE — Progress Notes (Signed)
   TELEHEALTH VIRTUAL OBSTETRICS VISIT ENCOUNTER NOTE  I connected with Chalice Ciancio on 08/05/18 at  8:55 AM EDT by telephone at home and verified that I am speaking with the correct person using two identifiers.   I discussed the limitations, risks, security and privacy concerns of performing an evaluation and management service by telephone and the availability of in person appointments. I also discussed with the patient that there may be a patient responsible charge related to this service. The patient expressed understanding and agreed to proceed.  Subjective:  Joanna Henry is a 23 y.o. G1P0 at [redacted]w[redacted]d being followed for ongoing prenatal care.  She is currently monitored for the following issues for this high-risk pregnancy and has Adjustment disorder with mixed disturbance of emotions and conduct; Supervision of high risk pregnancy, antepartum; Sickle cell trait (HCC); Alpha thalassemia silent carrier; High risk for Monosomy X (per Panorama); and Group B streptococcal bacteriuria on their problem list.  Patient reports no complaints. Reports fetal movement. Denies any contractions, bleeding or leaking of fluid.   The following portions of the patient's history were reviewed and updated as appropriate: allergies, current medications, past family history, past medical history, past social history, past surgical history and problem list.   Objective:   General:  Alert, oriented and cooperative.   Mental Status: Normal mood and affect perceived. Normal judgment and thought content.  Rest of physical exam deferred due to type of encounter  Assessment and Plan:  Pregnancy: G1P0 at [redacted]w[redacted]d 1. Supervision of high risk pregnancy, antepartum 2. She declined a glucola and is instead checking her sugars. She reported a value of 300 yesterday and a fbs today of 96. She is recording the values in babyscripts.  HBA1C ordered She does not have a scale at home. Her fastings are generally around 93 and  her 2 hours are all over the place depending on her diet. I have really encouraged that she stick with the diet. She tells me that she will do this.  Preterm labor symptoms and general obstetric precautions including but not limited to vaginal bleeding, contractions, leaking of fluid and fetal movement were reviewed in detail with the patient.  I discussed the assessment and treatment plan with the patient. The patient was provided an opportunity to ask questions and all were answered. The patient agreed with the plan and demonstrated an understanding of the instructions. The patient was advised to call back or seek an in-person office evaluation/go to MAU at [facility] for any urgent or concerning symptoms. Please refer to After Visit Summary for other counseling recommendations.   I provided 10 minutes of non-face-to-face time during this encounter.  No follow-ups on file.  Future Appointments  Date Time Provider Department Center  09/07/2018 10:30 AM WH-MFC NURSE WH-MFC MFC-US  09/07/2018 10:30 AM WH-MFC [place] 1 WH-MFCUS MFC-US    Allie Bossier, MD Center for [employer], [facility]

## 2018-08-05 NOTE — Telephone Encounter (Signed)
Spoke with patient to get her scheduled for her next OB appointment and lab work appointment. Patient stated that I can go ahead and schedule her OB appointment but she will call back to have her lab work appointment scheduled because it depends on her work schedule. Patient was scheduled for 6/17 @ 10:15 for OB appointment. Advised to give [place] a call back as soon as she knows when she can come in and do her labs. Patient verbalized understanding.

## 2018-08-06 ENCOUNTER — Other Ambulatory Visit: Payer: Medicaid Other

## 2018-08-06 DIAGNOSIS — O0990 Supervision of high risk pregnancy, unspecified, unspecified trimester: Secondary | ICD-10-CM | POA: Diagnosis not present

## 2018-08-06 LAB — HEMOGLOBIN A1C
Est. average glucose Bld gHb Est-mCnc: 108 mg/dL
Hgb A1c MFr Bld: 5.4 % (ref 4.8–5.6)

## 2018-08-06 NOTE — Progress Notes (Unsigned)
Pt was advised to bring in her BS log at her lab appt so that the provider can evaluate them.  Dr. Marice Potter observed pt's BS log and no new orders indicated.  Provider recommended for pt to continue with the regimen that she already implementing.  Notified pt provider recommendation.  Pt verbalized understanding.

## 2018-08-07 LAB — CBC
Hematocrit: 31.2 % — ABNORMAL LOW (ref 34.0–46.6)
Hemoglobin: 10.6 g/dL — ABNORMAL LOW (ref 11.1–15.9)
MCH: 28.2 pg (ref 26.6–33.0)
MCHC: 34 g/dL (ref 31.5–35.7)
MCV: 83 fL (ref 79–97)
Platelets: 290 10*3/uL (ref 150–450)
RBC: 3.76 x10E6/uL — ABNORMAL LOW (ref 3.77–5.28)
RDW: 11.6 % — ABNORMAL LOW (ref 11.7–15.4)
WBC: 5 10*3/uL (ref 3.4–10.8)

## 2018-08-07 LAB — RPR: RPR Ser Ql: NONREACTIVE

## 2018-08-07 LAB — HIV ANTIBODY (ROUTINE TESTING W REFLEX): HIV Screen 4th Generation wRfx: NONREACTIVE

## 2018-08-15 DIAGNOSIS — O0990 Supervision of high risk pregnancy, unspecified, unspecified trimester: Secondary | ICD-10-CM | POA: Diagnosis not present

## 2018-08-19 ENCOUNTER — Telehealth (INDEPENDENT_AMBULATORY_CARE_PROVIDER_SITE_OTHER): Payer: Medicaid Other | Admitting: Obstetrics and Gynecology

## 2018-08-19 ENCOUNTER — Encounter: Payer: Self-pay | Admitting: Obstetrics and Gynecology

## 2018-08-19 ENCOUNTER — Other Ambulatory Visit: Payer: Self-pay

## 2018-08-19 VITALS — BP 118/91 | HR 91

## 2018-08-19 DIAGNOSIS — R8271 Bacteriuria: Secondary | ICD-10-CM

## 2018-08-19 DIAGNOSIS — D573 Sickle-cell trait: Secondary | ICD-10-CM

## 2018-08-19 DIAGNOSIS — O0990 Supervision of high risk pregnancy, unspecified, unspecified trimester: Secondary | ICD-10-CM

## 2018-08-19 DIAGNOSIS — D563 Thalassemia minor: Secondary | ICD-10-CM

## 2018-08-19 DIAGNOSIS — Z3A32 32 weeks gestation of pregnancy: Secondary | ICD-10-CM

## 2018-08-19 DIAGNOSIS — O285 Abnormal chromosomal and genetic finding on antenatal screening of mother: Secondary | ICD-10-CM

## 2018-08-19 NOTE — Progress Notes (Signed)
   [place] VIRTUAL VIDEO VISIT ENCOUNTER NOTE  Provider location: Center for [employer] at [facility]   I connected with [name] on 08/19/18 at 10:15 AM EDT by MyChart Video Encounter at home and verified that I am speaking with the correct person using two identifiers.   I discussed the limitations, risks, security and privacy concerns of performing an evaluation and management service by telephone and the availability of in person appointments. I also discussed with the patient that there may be a patient responsible charge related to this service. The patient expressed understanding and agreed to proceed. Subjective:  Joanna Henry is a 23 y.o. G1P0 at [redacted]w[redacted]d being seen today for ongoing prenatal care.  She is currently monitored for the following issues for this high-risk pregnancy and has Adjustment disorder with mixed disturbance of emotions and conduct; Supervision of high risk pregnancy, antepartum; Sickle cell trait ([place]); Alpha thalassemia silent carrier; High risk for Monosomy X (per Panorama); and Group B streptococcal bacteriuria on their problem list.  Patient reports very active fetal movement.  Contractions: Not present. Vag. Bleeding: None.  Movement: Present. Denies any leaking of fluid.   The following portions of the patient's history were reviewed and updated as appropriate: allergies, current medications, past family history, past medical history, past social history, past surgical history and problem list.   Objective:   Vitals:   08/19/18 1106  BP: (!) 118/91  Pulse: 91  patient very excited and moving around while taking this blood pressure  Fetal Status:     Movement: Present     General:  Alert, oriented and cooperative. Patient is in no acute distress.  Respiratory: Normal respiratory effort, no problems with respiration noted  Mental Status: Normal mood and affect. Normal behavior. Normal judgment and thought content.  Rest  of physical exam deferred due to type of encounter  Imaging: No results found.  Assessment and Plan:  Pregnancy: G1P0 at [redacted]w[redacted]d  1. Supervision of high risk pregnancy, antepartum Declined GTT, checking CGB at home - FG: mostly 21 when she is truly fasting, states she does have higher readings but only occurs when she drinks juice in the middle of the night - PP: 120s - cont checking CBG - would like Nexplanon in hospital  2. Sickle cell trait (HCC) Urine cx Q trim  3. Alpha thalassemia silent carrier  4. High risk for Monosomy X (per Panorama) S/p counseling, declined amniocentesis  5. Group B streptococcal bacteriuria ppx in labor   Preterm labor symptoms and general obstetric precautions including but not limited to vaginal bleeding, contractions, leaking of fluid and fetal movement were reviewed in detail with the patient. I discussed the assessment and treatment plan with the patient. The patient was provided an opportunity to ask questions and all were answered. The patient agreed with the plan and demonstrated an understanding of the instructions. The patient was advised to call back or seek an in-person office evaluation/go to MAU at [facility] for any urgent or concerning symptoms. Please refer to After Visit Summary for other counseling recommendations.   I provided 15 minutes of face-to-face time during this encounter.  Return in about 2 weeks (around 09/02/2018) for OB visit (MD), virtual.  Future Appointments  Date Time Provider [place]  09/07/2018 10:30 AM [place] MFC-US  09/07/2018 10:30 AM WH-MFC [place] 1 WH-MFCUS MFC-US     M , [place] for [place], [place]

## 2018-08-19 NOTE — Progress Notes (Signed)
I connected with  [name] on 08/19/18 at 10:15 AM EDT by telephone and verified that I am speaking with the correct person using two identifiers.   I discussed the limitations, risks, security and privacy concerns of performing an evaluation and management service by telephone and the availability of in person appointments. I also discussed with the patient that there may be a patient responsible charge related to this service. The patient expressed understanding and agreed to proceed.  [place], CMA 08/19/2018  11:04 AM

## 2018-08-27 IMAGING — DX DG HAND COMPLETE 3+V*R*
3 series · 3 of 3 positions shown · non-contrast
Comparison: None.

CLINICAL DATA: Small finger pain after striking a wall.

EXAM:
RIGHT HAND - COMPLETE 3+ VIEW

[hand pa]
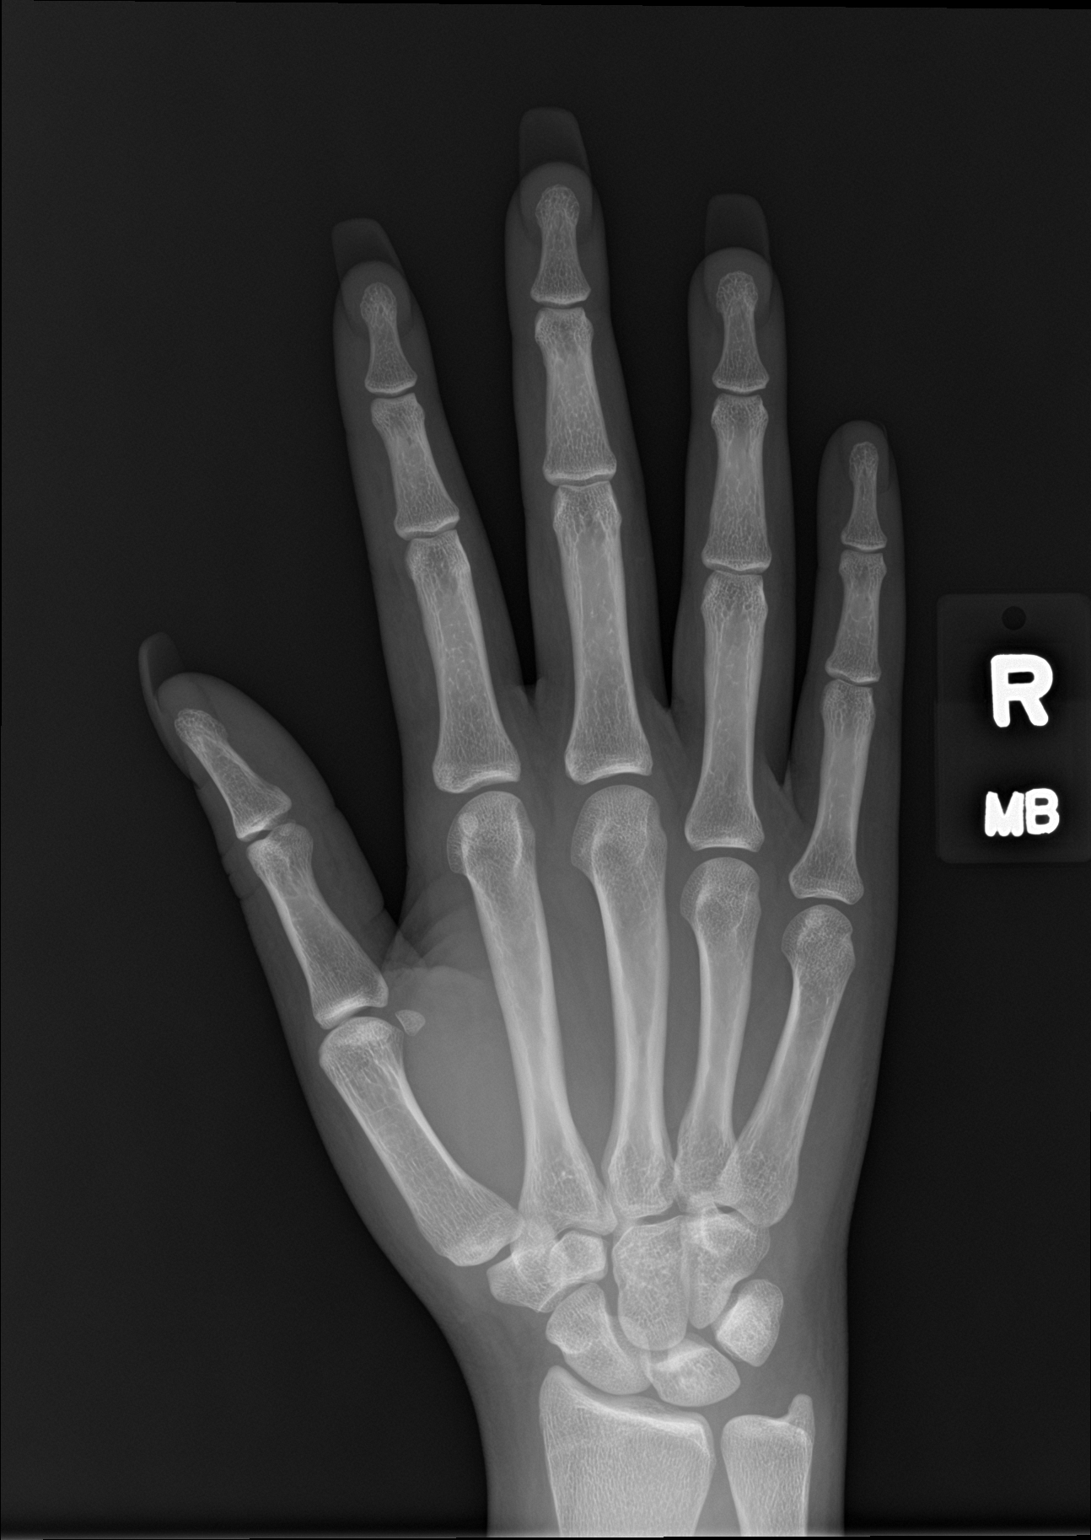

[hand obl]
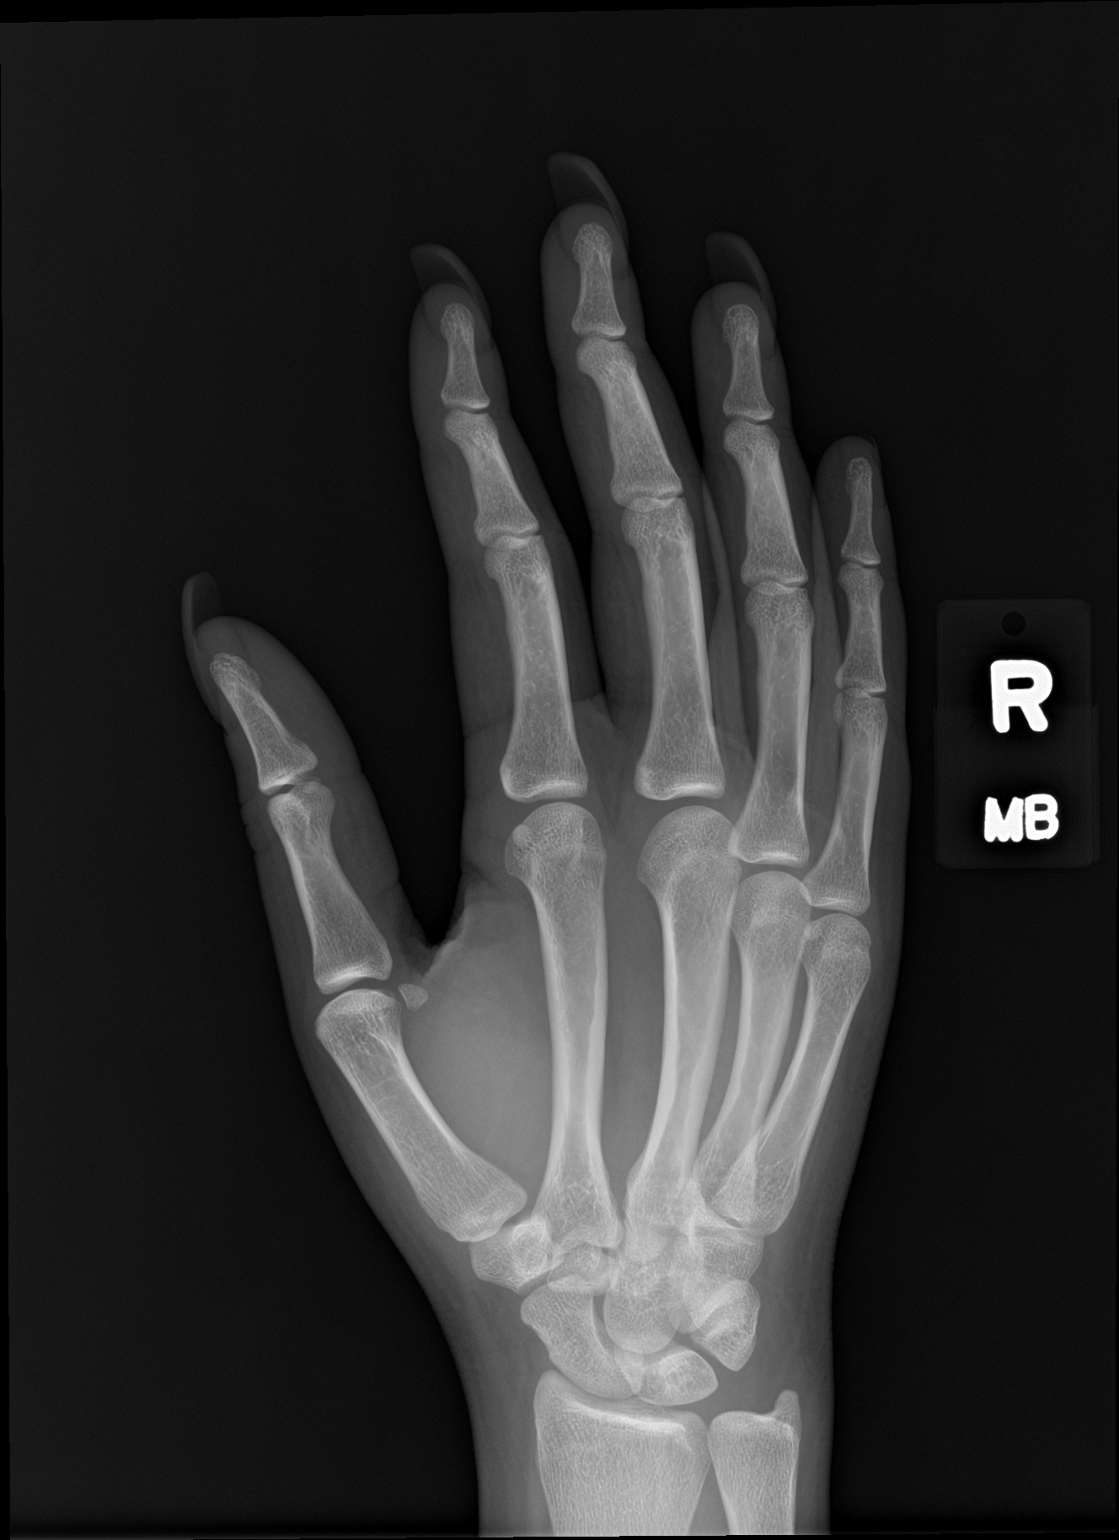

[hand lat]
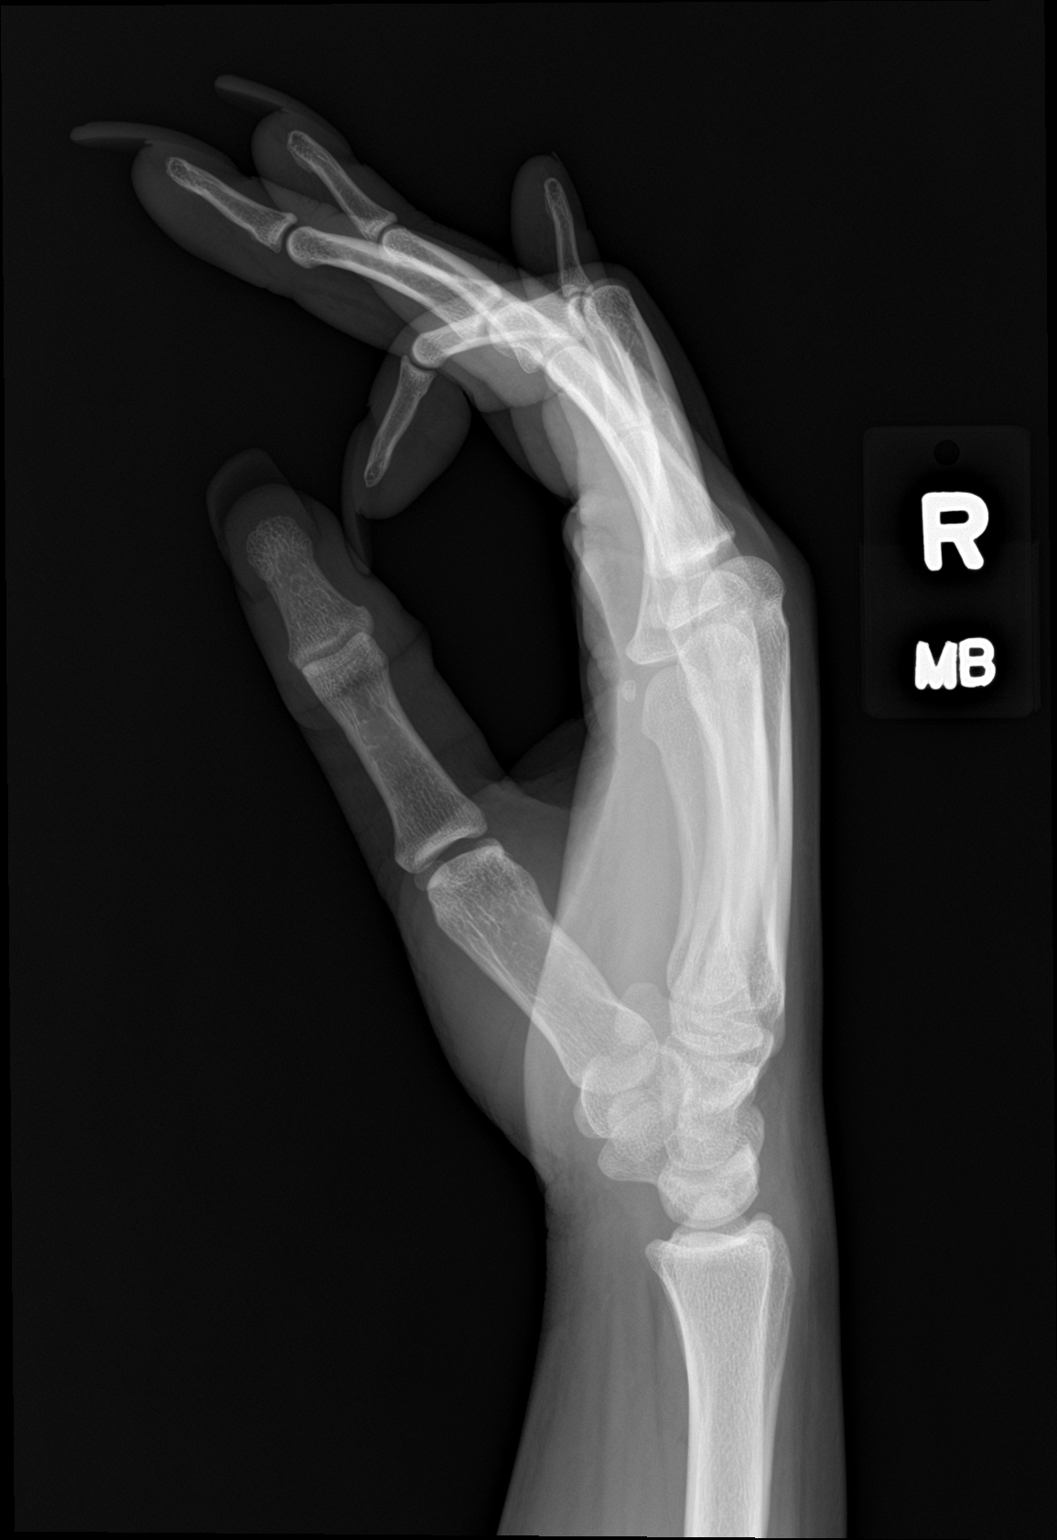

[3 of 3 positions shown; findings below may reference images not displayed]

FINDINGS: There is no evidence of fracture or dislocation. There is no
evidence of arthropathy or other focal bone abnormality. No foreign
body is identified.
IMPRESSION: Negative.

## 2018-09-01 ENCOUNTER — Encounter

## 2018-09-03 ENCOUNTER — Other Ambulatory Visit: Payer: Self-pay

## 2018-09-03 ENCOUNTER — Telehealth (INDEPENDENT_AMBULATORY_CARE_PROVIDER_SITE_OTHER): Payer: Medicaid Other | Admitting: Obstetrics and Gynecology

## 2018-09-03 ENCOUNTER — Encounter: Payer: Self-pay | Admitting: Obstetrics and Gynecology

## 2018-09-03 VITALS — BP 130/87 | HR 95

## 2018-09-03 DIAGNOSIS — D563 Thalassemia minor: Secondary | ICD-10-CM

## 2018-09-03 DIAGNOSIS — D573 Sickle-cell trait: Secondary | ICD-10-CM

## 2018-09-03 DIAGNOSIS — O0990 Supervision of high risk pregnancy, unspecified, unspecified trimester: Secondary | ICD-10-CM

## 2018-09-03 DIAGNOSIS — O285 Abnormal chromosomal and genetic finding on antenatal screening of mother: Secondary | ICD-10-CM

## 2018-09-03 DIAGNOSIS — O99013 Anemia complicating pregnancy, third trimester: Secondary | ICD-10-CM

## 2018-09-03 DIAGNOSIS — R8271 Bacteriuria: Secondary | ICD-10-CM

## 2018-09-03 DIAGNOSIS — Z3A34 34 weeks gestation of pregnancy: Secondary | ICD-10-CM

## 2018-09-03 DIAGNOSIS — O0993 Supervision of high risk pregnancy, unspecified, third trimester: Secondary | ICD-10-CM

## 2018-09-03 NOTE — Progress Notes (Signed)
Pt was concerned of pain in bottom, states when poops it really hurts, has a lot of pressure. Advised her to take at least 2- 500 mg of Tylenol every 8hrs. Get Murelax and and use a heating pad to relieve pressure & pain.Pt verbalized understanding.

## 2018-09-03 NOTE — Progress Notes (Signed)
   [place] VIRTUAL VIDEO VISIT ENCOUNTER NOTE  Provider location: Center for [employer] at [facility]   I connected with Joanna Henry on 09/03/18 at  4:15 PM EDT by MyChart Video Encounter at home and verified that I am speaking with the correct person using two identifiers.   I discussed the limitations, risks, security and privacy concerns of performing an evaluation and management service by telephone and the availability of in person appointments. I also discussed with the patient that there may be a patient responsible charge related to this service. The patient expressed understanding and agreed to proceed. Subjective:  Joanna Henry is a 23 y.o. G1P0 at [redacted]w[redacted]d being seen today for ongoing prenatal care.  She is currently monitored for the following issues for this low-risk pregnancy and has Adjustment disorder with mixed disturbance of emotions and conduct; Supervision of high risk pregnancy, antepartum; Sickle cell trait ([place]); Alpha thalassemia silent carrier; High risk for Monosomy X (per Panorama); and Group B streptococcal bacteriuria on their problem list.  Patient reports no complaints.  Contractions: Not present. Vag. Bleeding: None.  Movement: Present. Denies any leaking of fluid.   The following portions of the patient's history were reviewed and updated as appropriate: allergies, current medications, past family history, past medical history, past social history, past surgical history and problem list.   Objective:   Vitals:   09/03/18 1618  BP: 130/87  Pulse: 95    Fetal Status:     Movement: Present     General:  Alert, oriented and cooperative. Patient is in no acute distress.  Respiratory: Normal respiratory effort, no problems with respiration noted  Mental Status: Normal mood and affect. Normal behavior. Normal judgment and thought content.  Rest of physical exam deferred due to type of encounter  Imaging: No results found.   Assessment and Plan:  Pregnancy: G1P0 at [redacted]w[redacted]d 1. Group B streptococcal bacteriuria Will treat while in labor  2. Supervision of high risk pregnancy, antepartum Stable Vaginal cultures with next visit Declined GTT, checking CBG's reports in normal range  3. High risk for Monosomy X (per Panorama) U/S next week  4. Alpha thalassemia silent carrier   5. Sickle cell trait ([place]) UC with next visit  Preterm labor symptoms and general obstetric precautions including but not limited to vaginal bleeding, contractions, leaking of fluid and fetal movement were reviewed in detail with the patient. I discussed the assessment and treatment plan with the patient. The patient was provided an opportunity to ask questions and all were answered. The patient agreed with the plan and demonstrated an understanding of the instructions. The patient was advised to call back or seek an in-person office evaluation/go to MAU at [facility] for any urgent or concerning symptoms. Please refer to After Visit Summary for other counseling recommendations.   I provided 8 minutes of face-to-face time during this encounter.  Return in about 2 weeks (around 09/17/2018) for OB visit, face to face vaginal cultures only, no GBS needed.  Future Appointments  Date Time Provider [place]  09/07/2018 10:30 AM [place] MFC-US  09/07/2018 10:30 AM WH-MFC [place] 1 WH-MFCUS MFC-US    Chancy Milroy, [place] for [facility], [place]

## 2018-09-03 NOTE — Progress Notes (Signed)
Pt states records Glucose levels on Paper log.

## 2018-09-07 ENCOUNTER — Ambulatory Visit (HOSPITAL_COMMUNITY): Payer: Medicaid Other

## 2018-09-11 ENCOUNTER — Ambulatory Visit (HOSPITAL_COMMUNITY): Payer: Medicaid Other | Admitting: Obstetrics and Gynecology

## 2018-09-11 ENCOUNTER — Other Ambulatory Visit (HOSPITAL_COMMUNITY): Payer: Self-pay | Admitting: Obstetrics and Gynecology

## 2018-09-11 ENCOUNTER — Ambulatory Visit (HOSPITAL_COMMUNITY)
Admission: RE | Admit: 2018-09-11 | Discharge: 2018-09-11 | Disposition: A | Discharge status: Home / Self Care | Payer: Medicaid Other | Source: Ambulatory Visit | Attending: Obstetrics and Gynecology | Admitting: Obstetrics and Gynecology

## 2018-09-11 ENCOUNTER — Other Ambulatory Visit (HOSPITAL_COMMUNITY): Payer: Self-pay | Admitting: *Deleted

## 2018-09-11 ENCOUNTER — Ambulatory Visit (HOSPITAL_COMMUNITY): Payer: Medicaid Other | Admitting: *Deleted

## 2018-09-11 ENCOUNTER — Other Ambulatory Visit: Payer: Self-pay

## 2018-09-11 VITALS — BP 109/75 | HR 87 | Temp 98.0°F

## 2018-09-11 DIAGNOSIS — O24410 Gestational diabetes mellitus in pregnancy, diet controlled: Secondary | ICD-10-CM | POA: Insufficient documentation

## 2018-09-11 DIAGNOSIS — O289 Unspecified abnormal findings on antenatal screening of mother: Secondary | ICD-10-CM

## 2018-09-11 DIAGNOSIS — Z148 Genetic carrier of other disease: Secondary | ICD-10-CM

## 2018-09-11 DIAGNOSIS — Z362 Encounter for other antenatal screening follow-up: Secondary | ICD-10-CM | POA: Diagnosis not present

## 2018-09-11 DIAGNOSIS — O365990 Maternal care for other known or suspected poor fetal growth, unspecified trimester, not applicable or unspecified: Secondary | ICD-10-CM | POA: Insufficient documentation

## 2018-09-11 DIAGNOSIS — O365930 Maternal care for other known or suspected poor fetal growth, third trimester, not applicable or unspecified: Secondary | ICD-10-CM | POA: Diagnosis not present

## 2018-09-11 DIAGNOSIS — Z3A35 35 weeks gestation of pregnancy: Secondary | ICD-10-CM | POA: Diagnosis not present

## 2018-09-11 NOTE — Procedures (Signed)
Joanna Henry 06-Oct-1995 [redacted]w[redacted]d  Fetus A Non-Stress Test Interpretation for 09/11/18  Indication: IUGR  Fetal Heart Rate A Mode: External Baseline Rate (A): 135 bpm Variability: Moderate Accelerations: 15 x 15, 10 x 10 Decelerations: None Multiple birth?: No  Uterine Activity Mode: Palpation, Toco Contraction Frequency (min): U/I Contraction Duration (sec): 30 Contraction Quality: (S) Mild(Pt denies feeling any) Resting Tone Palpated: Relaxed Resting Time: Adequate  Interpretation (Fetal Testing) Nonstress Test Interpretation: Reactive Comments: Reviewed FHR tracing with Dr. Donalee Citrin

## 2018-09-12 ENCOUNTER — Other Ambulatory Visit (HOSPITAL_COMMUNITY): Payer: Self-pay | Admitting: Obstetrics and Gynecology

## 2018-09-17 ENCOUNTER — Other Ambulatory Visit: Payer: Self-pay

## 2018-09-17 ENCOUNTER — Encounter (HOSPITAL_COMMUNITY): Payer: Self-pay

## 2018-09-17 ENCOUNTER — Inpatient Hospital Stay (HOSPITAL_COMMUNITY)
Admission: AD | Admit: 2018-09-17 | Discharge: 2018-09-17 | Disposition: A | Discharge status: Home / Self Care | Payer: Medicaid Other | Attending: Obstetrics and Gynecology | Admitting: Obstetrics and Gynecology

## 2018-09-17 DIAGNOSIS — E161 Other hypoglycemia: Secondary | ICD-10-CM | POA: Diagnosis not present

## 2018-09-17 DIAGNOSIS — M545 Low back pain: Secondary | ICD-10-CM | POA: Insufficient documentation

## 2018-09-17 DIAGNOSIS — E162 Hypoglycemia, unspecified: Secondary | ICD-10-CM | POA: Diagnosis not present

## 2018-09-17 DIAGNOSIS — O26893 Other specified pregnancy related conditions, third trimester: Secondary | ICD-10-CM | POA: Insufficient documentation

## 2018-09-17 DIAGNOSIS — Z3A36 36 weeks gestation of pregnancy: Secondary | ICD-10-CM | POA: Insufficient documentation

## 2018-09-17 DIAGNOSIS — R1030 Lower abdominal pain, unspecified: Secondary | ICD-10-CM | POA: Diagnosis not present

## 2018-09-17 DIAGNOSIS — Z3493 Encounter for supervision of normal pregnancy, unspecified, third trimester: Secondary | ICD-10-CM

## 2018-09-17 DIAGNOSIS — O42913 Preterm premature rupture of membranes, unspecified as to length of time between rupture and onset of labor, third trimester: Secondary | ICD-10-CM

## 2018-09-17 DIAGNOSIS — R1084 Generalized abdominal pain: Secondary | ICD-10-CM | POA: Diagnosis not present

## 2018-09-17 DIAGNOSIS — M5489 Other dorsalgia: Secondary | ICD-10-CM | POA: Diagnosis not present

## 2018-09-17 LAB — AMNISURE RUPTURE OF MEMBRANE (ROM) NOT AT ARMC: Amnisure ROM: NEGATIVE

## 2018-09-17 MED ORDER — HYDROXYZINE HCL 25 MG PO TABS: 25.0000 mg | ORAL_TABLET | Freq: Four times a day (QID) | ORAL | 0 refills | Status: DC | PRN

## 2018-09-17 MED ORDER — PROMETHAZINE HCL 25 MG PO TABS
25.0000 mg | ORAL_TABLET | Freq: Once | ORAL | Status: AC
  Administered 2018-09-17: 25 mg via ORAL
  Filled 2018-09-17: qty 1

## 2018-09-17 MED ORDER — LACTATED RINGERS IV BOLUS: 1000.0000 mL | Freq: Once | INTRAVENOUS | Status: DC

## 2018-09-17 MED ORDER — HYDROXYZINE HCL 25 MG PO TABS
25.0000 mg | ORAL_TABLET | Freq: Once | ORAL | Status: AC
  Administered 2018-09-17: 25 mg via ORAL
  Filled 2018-09-17: qty 1

## 2018-09-17 NOTE — MAU Provider Note (Signed)
History     CSN: [id number]  Arrival date and time: 09/17/18 1423   First Provider Initiated Contact with Patient 09/17/18 1445      No chief complaint on file.  HPI Joanna Henry is a 23 y.o. G1P0 at 34w4dwho presents to MAU via EMA with concern for PROM early this morning. She reports waking up and feeling "white water" run down her legs. She denies contraction pain, vaginal bleeding, leaking of fluid, decreased fetal movement, fever, falls, or recent illness.   Patient endorses 10/10 pain in her lower back. Her pain does not radiate. She denies aggravating or alleviating factors. She has not taken medication or tried other treatments for this pain.  Patient's pregnancy is significant for fetal growth restriction, 2% in MFM 09/11/18. She states she is scheduled for an induction tomorrow and "would like to be admitted" for observation until then. She is anxious and tearful throughout initial assessment and is very afraid of being physically injured by staff.   OB History    Gravida  1   Para      Term      Preterm      AB      Living        SAB      TAB      Ectopic      Multiple      Live Births              Past Medical History:  Diagnosis Date  . ADHD (attention deficit hyperactivity disorder)     Past Surgical History:  Procedure Laterality Date  . NO PAST SURGERIES      History reviewed. No pertinent family history.  Social History   Tobacco Use  . Smoking status: Never Smoker  . Smokeless tobacco: Never Used  Substance Use Topics  . Alcohol use: No  . Drug use: Not Currently    Types: Marijuana    Comment: daily when unable to eat    Allergies:  Allergies  Allergen Reactions  . Fish Allergy Nausea And Vomiting    Medications Prior to Admission  Medication Sig Dispense Refill Last Dose  . Accu-Chek FastClix Lancets MISC 1 Units by Percutaneous route 4 (four) times daily. 100 each 12 09/17/2018 at Unknown time  . Blood Glucose  Monitoring Suppl (ACCU-CHEK GUIDE) w/Device KIT 1 Units by Does not apply route 4 (four) times daily. 1 kit 0 09/17/2018 at Unknown time  . glucose blood (ACCU-CHEK GUIDE) test strip Use to check blood sugars four times a day was instructed 50 each 12 09/17/2018 at Unknown time  . Prenatal Vit-Fe Fumarate-FA (PRENATAL VITAMIN PO) Take by mouth.   09/16/2018 at Unknown time  . AMBULATORY NON FORMULARY MEDICATION 1 Device by Other route 2 (two) times a week. Medication Name: Blood Pressure Cuff Monitored regularly at home. ICD O09.90 1 kit 0   . pantoprazole (PROTONIX) 20 MG tablet Take 1 tablet (20 mg total) by mouth daily. (Patient not taking: Reported on 09/03/2018) 30 tablet 5     Review of Systems  Constitutional: Negative for chills, fatigue and fever.  Respiratory: Negative for shortness of breath.   Gastrointestinal: Negative for abdominal pain.  Genitourinary: Positive for vaginal discharge. Negative for difficulty urinating, dysuria and vaginal bleeding.  Musculoskeletal: Positive for back pain.  Neurological: Negative for headaches.  All other systems reviewed and are negative.  Physical Exam   Blood pressure 118/78, pulse 100, temperature 98.2 F (36.8 C), resp. rate  12, last menstrual period 01/16/2018, SpO2 99 %.  Physical Exam  Nursing note and vitals reviewed. Constitutional: She is oriented to person, place, and time. She appears well-developed and well-nourished.  Cardiovascular: Normal rate.  Respiratory: Effort normal.  GI: She exhibits no distension. There is no abdominal tenderness. There is no rebound and no guarding.  Genitourinary:    Vaginal discharge present.     Genitourinary Comments: Thin white vaginal discharge on labia, within normal limits for third trimester   Neurological: She is alert and oriented to person, place, and time.  Skin: Skin is warm and dry.  Psychiatric: She has a normal mood and affect. Her behavior is normal. Judgment and thought content  normal.    MAU Course/MDM  Procedures  --Speculum exam attempted after RN finding of negative Maryann Alar --Patient screaming when approached with speculum, gagged and vomited when CNM stood at foot of bed with spec in hand --S/p Phenergan and Vistaril, Amnisure collected. Patient again screaming and scratching RN midway through Amnisure collection --Manual exam conducted after negative Amnisure --Cervix closed/thick/posterior. Pt tolerated exam with one finger --Reactive tracing: baseline 135, moderate variability, positive accels, no decels --Toco: rare contractions with UI --Early tracing compromised by patient's significant anxiety and frequent repositioning --No leaking of fluid at any time during evaluation in MAU --Unable to assess for pooling due to intolerance for speculum --Negative fern --Negative Amnisure --Patient has MFM appointment tomorrow at 0800 with OB follow-up immediately afterwards. Per MFM note 09/11/18 BPP 10/10, normal flow on Doppler, advised delivery at 37 weeks (09/20/18). Advised patient to prepare hospital bag in event of direct admission following appt tomorrow.  Patient Vitals for the past 24 hrs:  BP Temp Pulse Resp SpO2  09/17/18 1544 113/78 - 98 - -  09/17/18 1431 118/78 98.2 F (36.8 C) 100 12 99 %   Results for orders placed or performed during the hospital encounter of 09/17/18 (from the past 24 hour(s))  Amnisure rupture of membrane (rom)not at [facility]     Status: None   Collection Time: 09/17/18  3:11 PM  Result Value Ref Range   Amnisure ROM NEGATIVE    Assessment and Plan  --23 y.o. G1P0 at [redacted]w[redacted]d --Reactive tracing, closed cervix --Intact amniotic sac --Discharge home in stable condition  F/U: MFM/HROB appt tomorrow morning 0800  SDarlina Rumpf CNM 09/17/2018, 5:24 PM

## 2018-09-17 NOTE — Discharge Instructions (Signed)

## 2018-09-17 NOTE — MAU Note (Signed)
Pt states that she was asleep and woke up and was having lower abdominal pain. She states she felt like she had to pee, but before getting her pants down. Pt states that fluid was coming out.   Pt states the fluid was clear.   Denies vaginal bleeding.   Pt reports +FM

## 2018-09-18 ENCOUNTER — Other Ambulatory Visit (HOSPITAL_COMMUNITY): Payer: Self-pay | Admitting: *Deleted

## 2018-09-18 ENCOUNTER — Encounter (HOSPITAL_COMMUNITY): Payer: Self-pay

## 2018-09-18 ENCOUNTER — Ambulatory Visit (HOSPITAL_COMMUNITY): Payer: Medicaid Other | Admitting: *Deleted

## 2018-09-18 ENCOUNTER — Other Ambulatory Visit (HOSPITAL_COMMUNITY)
Admission: RE | Admit: 2018-09-18 | Discharge: 2018-09-18 | Disposition: A | Discharge status: Home / Self Care | Payer: Medicaid Other | Source: Ambulatory Visit | Attending: Obstetrics & Gynecology | Admitting: Obstetrics & Gynecology

## 2018-09-18 ENCOUNTER — Other Ambulatory Visit: Payer: Self-pay | Admitting: Advanced Practice Midwife

## 2018-09-18 ENCOUNTER — Other Ambulatory Visit (HOSPITAL_COMMUNITY)
Admission: RE | Admit: 2018-09-18 | Discharge: 2018-09-18 | Disposition: A | Discharge status: Home / Self Care | Payer: Medicaid Other | Source: Ambulatory Visit | Attending: Obstetrics and Gynecology | Admitting: Obstetrics and Gynecology

## 2018-09-18 ENCOUNTER — Ambulatory Visit (HOSPITAL_COMMUNITY)
Admission: RE | Admit: 2018-09-18 | Discharge: 2018-09-18 | Disposition: A | Discharge status: Home / Self Care | Payer: Medicaid Other | Source: Ambulatory Visit | Attending: Obstetrics and Gynecology | Admitting: Obstetrics and Gynecology

## 2018-09-18 ENCOUNTER — Ambulatory Visit (INDEPENDENT_AMBULATORY_CARE_PROVIDER_SITE_OTHER): Payer: Medicaid Other | Admitting: Obstetrics & Gynecology

## 2018-09-18 DIAGNOSIS — Z148 Genetic carrier of other disease: Secondary | ICD-10-CM

## 2018-09-18 DIAGNOSIS — O0990 Supervision of high risk pregnancy, unspecified, unspecified trimester: Secondary | ICD-10-CM

## 2018-09-18 DIAGNOSIS — O365990 Maternal care for other known or suspected poor fetal growth, unspecified trimester, not applicable or unspecified: Secondary | ICD-10-CM

## 2018-09-18 DIAGNOSIS — O365930 Maternal care for other known or suspected poor fetal growth, third trimester, not applicable or unspecified: Secondary | ICD-10-CM | POA: Diagnosis not present

## 2018-09-18 DIAGNOSIS — R8271 Bacteriuria: Secondary | ICD-10-CM

## 2018-09-18 DIAGNOSIS — Z3A36 36 weeks gestation of pregnancy: Secondary | ICD-10-CM | POA: Diagnosis not present

## 2018-09-18 DIAGNOSIS — Z1159 Encounter for screening for other viral diseases: Secondary | ICD-10-CM | POA: Insufficient documentation

## 2018-09-18 DIAGNOSIS — O289 Unspecified abnormal findings on antenatal screening of mother: Secondary | ICD-10-CM | POA: Diagnosis not present

## 2018-09-18 DIAGNOSIS — O365931 Maternal care for other known or suspected poor fetal growth, third trimester, fetus 1: Secondary | ICD-10-CM | POA: Insufficient documentation

## 2018-09-18 LAB — SARS CORONAVIRUS 2 (TAT 6-24 HRS): SARS Coronavirus 2: NEGATIVE

## 2018-09-18 NOTE — Progress Notes (Signed)
   PRENATAL VISIT NOTE  Subjective:  Joanna Henry is a 23 y.o. G1P0 at [redacted]w[redacted]d being seen today for ongoing prenatal care.  She is currently monitored for the following issues for this high-risk pregnancy and has Adjustment disorder with mixed disturbance of emotions and conduct; Supervision of high risk pregnancy, antepartum; Sickle cell trait ([place]); Alpha thalassemia silent carrier; High risk for Monosomy X (per [place]orama); Group B streptococcal bacteriuria; and Pregnancy affected by fetal growth restriction on their problem list.  Patient reports no complaints.   .  .   . Denies leaking of fluid.   The following portions of the patient's history were reviewed and updated as appropriate: allergies, current medications, past family history, past medical history, past social history, past surgical history and problem list.   Objective:  There were no vitals filed for this visit.  Fetal Status:           General:  Alert, oriented and cooperative. Patient is in no acute distress.  Skin: Skin is warm and dry. No rash noted.   Cardiovascular: Normal heart rate noted  Respiratory: Normal respiratory effort, no problems with respiration noted  Abdomen: Soft, gravid, appropriate for gestational age.        Pelvic: Cervical exam deferred        Extremities: Normal range of motion.     Mental Status: Normal mood and affect. Normal behavior. Normal judgment and thought content.   Assessment and Plan:  Pregnancy: G1P0 at [redacted]w[redacted]d 1. Pregnancy affected by fetal growth restriction  Per MFM, will have IOL at 37 weeks, She will go to MAU today for COVID testing  She has + GBS in urine and will be treated in labor - GC/Chlamydia probe amp ([place])not at [facility]  Preterm labor symptoms and general obstetric precautions including but not limited to vaginal bleeding, contractions, leaking of fluid and fetal movement were reviewed in detail with the patient. Please refer to After Visit Summary for other  counseling recommendations.   Return in about 4 weeks (around 10/16/2018) for postpartum exam, nexplanon.  Future Appointments  Date Time Provider [place]  09/20/2018 12:00 AM [facility] [place] None  09/25/2018 10:55 AM Truett Mainland, DO [facility] WOC  10/02/2018 10:15 AM Harolyn Rutherford, Sallyanne Havers, MD [facility] WOC  10/09/2018  8:15 AM Donnamae Jude, MD WOC-WOCA WOC    Emily Filbert, MD

## 2018-09-18 NOTE — Procedures (Signed)
Reni Brissette [birth year] [redacted]w[redacted]d  Fetus A Non-Stress Test Interpretation for 09/18/18  Indication: IUGR  Fetal Heart Rate A Mode: External Baseline Rate (A): 135 bpm Variability: Minimal, Moderate Accelerations: 10 x 10 Decelerations: None Multiple birth?: No  Uterine Activity Mode: Palpation, Toco Contraction Frequency (min): 2-3 Contraction Duration (sec): 60-70 Contraction Quality: Mild Resting Tone Palpated: Relaxed Resting Time: Adequate  Interpretation (Fetal Testing) Nonstress Test Interpretation: Non-reactive Comments: Reviewed FHR tracing with Dr. Donalee Citrin

## 2018-09-18 NOTE — MAU Note (Signed)
Covid swab collected. PT tolerated well. PT asymptomatic 

## 2018-09-18 NOTE — Progress Notes (Signed)
MAU nurse, Olivia Mackie, notified of pt arrival for COVID testing prior to pt's induction scheduled.

## 2018-09-20 ENCOUNTER — Inpatient Hospital Stay (HOSPITAL_COMMUNITY): Payer: Medicaid Other

## 2018-09-20 ENCOUNTER — Inpatient Hospital Stay (HOSPITAL_COMMUNITY): Payer: Medicaid Other | Admitting: Anesthesiology

## 2018-09-20 ENCOUNTER — Other Ambulatory Visit: Payer: Self-pay

## 2018-09-20 ENCOUNTER — Encounter (HOSPITAL_COMMUNITY): Payer: Self-pay | Admitting: *Deleted

## 2018-09-20 ENCOUNTER — Inpatient Hospital Stay (HOSPITAL_COMMUNITY)
Admission: AD | Admit: 2018-09-20 | Discharge: 2018-09-22 | DRG: 807 | Disposition: A | Discharge status: Home / Self Care | Payer: Medicaid Other | Attending: Family Medicine | Admitting: Family Medicine

## 2018-09-20 DIAGNOSIS — O365990 Maternal care for other known or suspected poor fetal growth, unspecified trimester, not applicable or unspecified: Secondary | ICD-10-CM | POA: Diagnosis present

## 2018-09-20 DIAGNOSIS — O9902 Anemia complicating childbirth: Secondary | ICD-10-CM | POA: Diagnosis present

## 2018-09-20 DIAGNOSIS — F4325 Adjustment disorder with mixed disturbance of emotions and conduct: Secondary | ICD-10-CM | POA: Diagnosis present

## 2018-09-20 DIAGNOSIS — R8271 Bacteriuria: Secondary | ICD-10-CM | POA: Diagnosis present

## 2018-09-20 DIAGNOSIS — Z3A37 37 weeks gestation of pregnancy: Secondary | ICD-10-CM | POA: Diagnosis not present

## 2018-09-20 DIAGNOSIS — O365930 Maternal care for other known or suspected poor fetal growth, third trimester, not applicable or unspecified: Secondary | ICD-10-CM | POA: Diagnosis not present

## 2018-09-20 DIAGNOSIS — O99824 Streptococcus B carrier state complicating childbirth: Secondary | ICD-10-CM | POA: Diagnosis present

## 2018-09-20 DIAGNOSIS — O285 Abnormal chromosomal and genetic finding on antenatal screening of mother: Secondary | ICD-10-CM | POA: Diagnosis present

## 2018-09-20 DIAGNOSIS — O0990 Supervision of high risk pregnancy, unspecified, unspecified trimester: Secondary | ICD-10-CM

## 2018-09-20 DIAGNOSIS — Z1159 Encounter for screening for other viral diseases: Secondary | ICD-10-CM

## 2018-09-20 DIAGNOSIS — D563 Thalassemia minor: Secondary | ICD-10-CM | POA: Diagnosis present

## 2018-09-20 DIAGNOSIS — D573 Sickle-cell trait: Secondary | ICD-10-CM | POA: Diagnosis present

## 2018-09-20 DIAGNOSIS — O99344 Other mental disorders complicating childbirth: Secondary | ICD-10-CM | POA: Diagnosis present

## 2018-09-20 LAB — TYPE AND SCREEN
ABO/RH(D): O POS
Antibody Screen: NEGATIVE

## 2018-09-20 LAB — CBC
HCT: 31.9 % — ABNORMAL LOW (ref 36.0–46.0)
Hemoglobin: 10.8 g/dL — ABNORMAL LOW (ref 12.0–15.0)
MCH: 27.9 pg (ref 26.0–34.0)
MCHC: 33.9 g/dL (ref 30.0–36.0)
MCV: 82.4 fL (ref 80.0–100.0)
Platelets: 368 10*3/uL (ref 150–400)
RBC: 3.87 MIL/uL (ref 3.87–5.11)
RDW: 12.6 % (ref 11.5–15.5)
WBC: 8 10*3/uL (ref 4.0–10.5)
nRBC: 0 % (ref 0.0–0.2)

## 2018-09-20 LAB — RPR: RPR Ser Ql: NONREACTIVE

## 2018-09-20 MED ORDER — OXYCODONE-ACETAMINOPHEN 5-325 MG PO TABS: 1.0000 | ORAL_TABLET | ORAL | Status: DC | PRN

## 2018-09-20 MED ORDER — LACTATED RINGERS IV SOLN
500.0000 mL | INTRAVENOUS | Status: DC | PRN
  Administered 2018-09-20 (×2): 500 mL via INTRAVENOUS

## 2018-09-20 MED ORDER — FENTANYL-BUPIVACAINE-NACL 0.5-0.125-0.9 MG/250ML-% EP SOLN
EPIDURAL | Status: AC
  Filled 2018-09-20: qty 250

## 2018-09-20 MED ORDER — DIPHENHYDRAMINE HCL 50 MG/ML IJ SOLN: 12.5000 mg | INTRAMUSCULAR | Status: DC | PRN

## 2018-09-20 MED ORDER — PHENYLEPHRINE 40 MCG/ML (10ML) SYRINGE FOR IV PUSH (FOR BLOOD PRESSURE SUPPORT): 80.0000 ug | PREFILLED_SYRINGE | INTRAVENOUS | Status: DC | PRN

## 2018-09-20 MED ORDER — FENTANYL CITRATE (PF) 100 MCG/2ML IJ SOLN
100.0000 ug | Freq: Once | INTRAMUSCULAR | Status: AC
  Administered 2018-09-20: 100 ug via EPIDURAL

## 2018-09-20 MED ORDER — OXYTOCIN 40 UNITS IN NORMAL SALINE INFUSION - SIMPLE MED
2.5000 [IU]/h | INTRAVENOUS | Status: DC
  Filled 2018-09-20: qty 1000

## 2018-09-20 MED ORDER — FENTANYL-BUPIVACAINE-NACL 0.5-0.125-0.9 MG/250ML-% EP SOLN: 12.0000 mL/h | EPIDURAL | Status: DC | PRN

## 2018-09-20 MED ORDER — ONDANSETRON HCL 4 MG/2ML IJ SOLN: 4.0000 mg | Freq: Four times a day (QID) | INTRAMUSCULAR | Status: DC | PRN

## 2018-09-20 MED ORDER — PENICILLIN G 3 MILLION UNITS IVPB - SIMPLE MED
3.0000 10*6.[IU] | INTRAVENOUS | Status: DC
  Administered 2018-09-20 (×4): 3 10*6.[IU] via INTRAVENOUS
  Filled 2018-09-20 (×4): qty 100

## 2018-09-20 MED ORDER — ONDANSETRON HCL 4 MG/2ML IJ SOLN: 4.0000 mg | INTRAMUSCULAR | Status: DC | PRN

## 2018-09-20 MED ORDER — BUPIVACAINE HCL (PF) 0.25 % IJ SOLN
INTRAMUSCULAR | Status: DC | PRN
  Administered 2018-09-20: 8 mL via EPIDURAL

## 2018-09-20 MED ORDER — DIPHENHYDRAMINE HCL 25 MG PO CAPS: 25.0000 mg | ORAL_CAPSULE | Freq: Four times a day (QID) | ORAL | Status: DC | PRN

## 2018-09-20 MED ORDER — PHENYLEPHRINE 40 MCG/ML (10ML) SYRINGE FOR IV PUSH (FOR BLOOD PRESSURE SUPPORT)
PREFILLED_SYRINGE | INTRAVENOUS | Status: AC
  Filled 2018-09-20: qty 10

## 2018-09-20 MED ORDER — FENTANYL CITRATE (PF) 100 MCG/2ML IJ SOLN: 100.0000 ug | INTRAMUSCULAR | Status: DC | PRN

## 2018-09-20 MED ORDER — OXYTOCIN BOLUS FROM INFUSION
500.0000 mL | Freq: Once | INTRAVENOUS | Status: DC
  Administered 2018-09-20: 500 mL via INTRAVENOUS

## 2018-09-20 MED ORDER — BENZOCAINE-MENTHOL 20-0.5 % EX AERO
1.0000 "application " | INHALATION_SPRAY | CUTANEOUS | Status: DC | PRN
  Administered 2018-09-21: 1 via TOPICAL
  Filled 2018-09-20 (×2): qty 56

## 2018-09-20 MED ORDER — TETANUS-DIPHTH-ACELL PERTUSSIS 5-2.5-18.5 LF-MCG/0.5 IM SUSP: 0.5000 mL | Freq: Once | INTRAMUSCULAR | Status: DC

## 2018-09-20 MED ORDER — ONDANSETRON HCL 4 MG PO TABS: 4.0000 mg | ORAL_TABLET | ORAL | Status: DC | PRN

## 2018-09-20 MED ORDER — TERBUTALINE SULFATE 1 MG/ML IJ SOLN: 0.2500 mg | Freq: Once | INTRAMUSCULAR | Status: DC | PRN

## 2018-09-20 MED ORDER — SIMETHICONE 80 MG PO CHEW: 80.0000 mg | CHEWABLE_TABLET | ORAL | Status: DC | PRN

## 2018-09-20 MED ORDER — IBUPROFEN 600 MG PO TABS
600.0000 mg | ORAL_TABLET | Freq: Four times a day (QID) | ORAL | Status: DC
  Administered 2018-09-21 – 2018-09-22 (×7): 600 mg via ORAL
  Filled 2018-09-20 (×7): qty 1

## 2018-09-20 MED ORDER — COCONUT OIL OIL: 1.0000 "application " | TOPICAL_OIL | Status: DC | PRN

## 2018-09-20 MED ORDER — ZOLPIDEM TARTRATE 5 MG PO TABS: 5.0000 mg | ORAL_TABLET | Freq: Every evening | ORAL | Status: DC | PRN

## 2018-09-20 MED ORDER — LACTATED RINGERS IV SOLN
INTRAVENOUS | Status: DC
  Administered 2018-09-20 (×4): via INTRAVENOUS

## 2018-09-20 MED ORDER — LIDOCAINE HCL (PF) 1 % IJ SOLN
INTRAMUSCULAR | Status: DC | PRN
  Administered 2018-09-20: 5 mL via EPIDURAL

## 2018-09-20 MED ORDER — LACTATED RINGERS IV SOLN
500.0000 mL | Freq: Once | INTRAVENOUS | Status: AC
  Administered 2018-09-20: 500 mL via INTRAVENOUS

## 2018-09-20 MED ORDER — FENTANYL CITRATE (PF) 100 MCG/2ML IJ SOLN
50.0000 ug | INTRAMUSCULAR | Status: DC | PRN
  Administered 2018-09-20: 50 ug via INTRAVENOUS
  Filled 2018-09-20 (×2): qty 2

## 2018-09-20 MED ORDER — MISOPROSTOL 50MCG HALF TABLET
50.0000 ug | ORAL_TABLET | Freq: Once | ORAL | Status: AC
  Administered 2018-09-20: 50 ug via BUCCAL

## 2018-09-20 MED ORDER — OXYCODONE-ACETAMINOPHEN 5-325 MG PO TABS: 2.0000 | ORAL_TABLET | ORAL | Status: DC | PRN

## 2018-09-20 MED ORDER — EPHEDRINE 5 MG/ML INJ: 10.0000 mg | INTRAVENOUS | Status: DC | PRN

## 2018-09-20 MED ORDER — SODIUM CHLORIDE 0.9 % IV SOLN
5.0000 10*6.[IU] | Freq: Once | INTRAVENOUS | Status: AC
  Administered 2018-09-20: 5 10*6.[IU] via INTRAVENOUS
  Filled 2018-09-20: qty 5

## 2018-09-20 MED ORDER — SENNOSIDES-DOCUSATE SODIUM 8.6-50 MG PO TABS
2.0000 | ORAL_TABLET | ORAL | Status: DC
  Administered 2018-09-21 (×2): 2 via ORAL
  Filled 2018-09-20 (×2): qty 2

## 2018-09-20 MED ORDER — LIDOCAINE HCL (PF) 1 % IJ SOLN
30.0000 mL | INTRAMUSCULAR | Status: AC | PRN
  Administered 2018-09-20: 30 mL via SUBCUTANEOUS
  Filled 2018-09-20: qty 30

## 2018-09-20 MED ORDER — DIBUCAINE (PERIANAL) 1 % EX OINT: 1.0000 "application " | TOPICAL_OINTMENT | CUTANEOUS | Status: DC | PRN

## 2018-09-20 MED ORDER — MISOPROSTOL 50MCG HALF TABLET
ORAL_TABLET | ORAL | Status: AC
  Administered 2018-09-20: 50 ug via BUCCAL
  Filled 2018-09-20: qty 1

## 2018-09-20 MED ORDER — SOD CITRATE-CITRIC ACID 500-334 MG/5ML PO SOLN: 30.0000 mL | ORAL | Status: DC | PRN

## 2018-09-20 MED ORDER — ACETAMINOPHEN 325 MG PO TABS
650.0000 mg | ORAL_TABLET | ORAL | Status: DC | PRN
  Administered 2018-09-21 – 2018-09-22 (×4): 650 mg via ORAL
  Filled 2018-09-20 (×4): qty 2

## 2018-09-20 MED ORDER — LORAZEPAM 2 MG/ML IJ SOLN
0.5000 mg | INTRAMUSCULAR | Status: DC | PRN
  Administered 2018-09-20: 0.5 mg via INTRAVENOUS
  Filled 2018-09-20: qty 1

## 2018-09-20 MED ORDER — WITCH HAZEL-GLYCERIN EX PADS: 1.0000 "application " | MEDICATED_PAD | CUTANEOUS | Status: DC | PRN

## 2018-09-20 MED ORDER — MISOPROSTOL 50MCG HALF TABLET
50.0000 ug | ORAL_TABLET | Freq: Once | ORAL | Status: AC
  Administered 2018-09-20: 50 ug via BUCCAL
  Filled 2018-09-20: qty 1

## 2018-09-20 MED ORDER — SODIUM CHLORIDE (PF) 0.9 % IJ SOLN
INTRAMUSCULAR | Status: DC | PRN
  Administered 2018-09-20: 12 mL/h via EPIDURAL

## 2018-09-20 MED ORDER — HYDROXYZINE HCL 10 MG PO TABS
10.0000 mg | ORAL_TABLET | Freq: Three times a day (TID) | ORAL | Status: DC | PRN
  Administered 2018-09-20: 10 mg via ORAL
  Filled 2018-09-20 (×2): qty 1

## 2018-09-20 MED ORDER — ACETAMINOPHEN 325 MG PO TABS: 650.0000 mg | ORAL_TABLET | ORAL | Status: DC | PRN

## 2018-09-20 MED ORDER — OXYTOCIN 40 UNITS IN NORMAL SALINE INFUSION - SIMPLE MED
1.0000 m[IU]/min | INTRAVENOUS | Status: DC
  Administered 2018-09-20: 2 m[IU]/min via INTRAVENOUS

## 2018-09-20 MED ORDER — FENTANYL CITRATE (PF) 100 MCG/2ML IJ SOLN
INTRAMUSCULAR | Status: DC | PRN
  Administered 2018-09-20: 100 ug via EPIDURAL
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
## 2018-09-20 NOTE — Progress Notes (Signed)
Patient's cell phone called when patient did not show up at scheduled time for induction.  Patient answered and agreed to come in as soon as possible.

## 2018-09-20 NOTE — Anesthesia Preprocedure Evaluation (Signed)
Anesthesia Evaluation  Patient identified by MRN, date of birth, ID band Patient awake    Reviewed: Allergy & Precautions, NPO status , Patient's Chart, lab work & pertinent test results  Airway Mallampati: II  TM Distance: >3 FB Neck ROM: Full    Dental no notable dental hx. (+) Teeth Intact   Pulmonary neg pulmonary ROS,    Pulmonary exam normal breath sounds clear to auscultation       Cardiovascular Exercise Tolerance: Good negative cardio ROS Normal cardiovascular exam Rhythm:Regular Rate:Normal     Neuro/Psych PSYCHIATRIC DISORDERS negative neurological ROS     GI/Hepatic   Endo/Other    Renal/GU      Musculoskeletal   Abdominal   Peds  Hematology Hgb 10.8 Plt 368   Anesthesia Other Findings   Reproductive/Obstetrics (+) Pregnancy                             Anesthesia Physical Anesthesia Plan  ASA: II  Anesthesia Plan: Epidural   Post-op Pain Management:    Induction:   PONV Risk Score and Plan:   Airway Management Planned:   Additional Equipment:   Intra-op Plan:   Post-operative Plan:   Informed Consent: I have reviewed the patients History and Physical, chart, labs and discussed the procedure including the risks, benefits and alternatives for the proposed anesthesia with the patient or authorized representative who has indicated his/her understanding and acceptance.       Plan Discussed with:   Anesthesia Plan Comments:         Anesthesia Quick Evaluation

## 2018-09-20 NOTE — Progress Notes (Signed)
Labor Progress Note Joanna Henry is a 23 y.o. G1P0 at [redacted]w[redacted]d presented for IOL for IUGR  S:  Comfortable with epidural. Not feeling ctx.   O:  BP 113/80   Pulse 82   Temp 98.1 F (36.7 C) (Oral)   Resp 16   Ht 5\' 6"  (1.676 m)   Wt 93.7 kg   LMP 01/16/2018 (Exact Date)   SpO2 97%   BMI 33.33 kg/m  EFM: baseline 135 bpm/ mod variability/ no accels/ variable decels  Toco: irregular SVE: Dilation: 3 Effacement (%): 70 Station: -2 Presentation: Vertex Exam by:: Julianne Handler, CNM   A/P: 23 y.o. G1P0 [redacted]w[redacted]d  1. Labor: latent 2. FWB: Cat II 3. Pain: epidural  Consented for exam, tolerated well. Cervix favorable, will start Pitocin. Maternal repositioning for variable decels, suspect oligo and cord compression. Consider AROM and amnioinfusion if persists. Anticipate labor progress and SVD.  Julianne Handler, CNM 2:49 PM

## 2018-09-20 NOTE — Discharge Summary (Signed)
Postpartum Discharge Summary     Patient Name: Joanna Henry DOB: 09-Jul-1995 MRN: [id number]  Date of admission: 09/20/2018 Delivering Provider: Gerlene Fee   Date of discharge: 09/22/2018  Admitting diagnosis: pregnancy Intrauterine pregnancy: [redacted]w[redacted]d    Secondary diagnosis:  Active Problems:   Adjustment disorder with mixed disturbance of emotions and conduct   Sickle cell trait ([place]   Alpha thalassemia silent carrier   High risk for Monosomy X (per [place])   Group B streptococcal bacteriuria   Pregnancy affected by fetal growth restriction  Additional problems: none     Discharge diagnosis: Term Pregnancy Delivered                                                                                                Post partum procedures:n/a  Augmentation: Pitocin and Cytotec  Complications: None  Hospital course:  Induction of Labor With Vaginal Delivery   23y.o. yo G1P0 at 329w0das admitted to the hospital 09/20/2018 for induction of labor.  Indication for induction: IUGR.  Patient had an uncomplicated labor course, delivering <24hr after induction started, as follows: Membrane Rupture Time/Date: 8:00 PM ,09/20/2018   Intrapartum Procedures: Episiotomy: None [1]                                         Lacerations:  1st degree [2];Perineal [11]  Patient had delivery of a Viable infant.  Information for the patient's newborn:  PrYasmene, Salomone0[[id number]]Delivery Method: Vaginal, Spontaneous(Filed from Delivery Summary)    09/20/2018  Details of delivery can be found in separate delivery note.  Patient had a routine postpartum course. Infant currently has genetic testing in process to determine if she is positive for Turner Syndrome. Patient is discharged home 09/22/18.  Magnesium Sulfate recieved: No BMZ received: No  Physical exam  Vitals:   09/21/18 0730 09/21/18 1500 09/21/18 2133 09/22/18 0500  BP: (!) 119/94 111/79 111/72 108/67  Pulse: 78 90 90 89   Resp: _0 Temp: (!) 97.5 F (36.4 C) 98 F (36.7 C) 98.2 F (36.8 C) 98.2 F (36.8 C)  TempSrc: Oral Oral Oral Oral  SpO2:      Weight:      Height:       General: alert and cooperative Lochia: appropriate Uterine Fundus: firm Incision: N/A DVT Evaluation: No evidence of DVT seen on physical exam. Labs: Lab Results  Component Value Date   WBC 8.1 09/21/2018   HGB 9.5 (L) 09/21/2018   HCT 28.3 (L) 09/21/2018   MCV 82.3 09/21/2018   PLT 261 09/21/2018   CMP Latest Ref Rng & Units 04/21/2018  Glucose 70 - 99 mg/dL 93  BUN 6 - 20 mg/dL 5(L)  Creatinine 0.44 - 1.00 mg/dL 0.51  Sodium 135 - 145 mmol/L 136  Potassium 3.5 - 5.1 mmol/L 4.4  Chloride 98 - 111 mmol/L 104  CO2 22 - 32 mmol/L 22  Calcium 8.9 - 10.3 mg/dL 9.9  Total Protein  6.5 - 8.1 g/dL 7.4  Total Bilirubin 0.3 - 1.2 mg/dL 0.4  Alkaline Phos 38 - 126 U/L 34(L)  AST 15 - 41 U/L 14(L)  ALT 0 - 44 U/L 17    Discharge instruction: per After Visit Summary and "Baby and Me Booklet".  After visit meds:  Allergies as of 09/22/2018      Reactions   Fish Allergy Nausea And Vomiting      Medication List    STOP taking these medications   Accu-Chek FastClix Lancets Misc   Accu-Chek Guide w/Device Kit   AMBULATORY NON FORMULARY MEDICATION   glucose blood test strip Commonly known as: Accu-Chek Guide   hydrOXYzine 25 MG tablet Commonly known as: ATARAX/VISTARIL   pantoprazole 20 MG tablet Commonly known as: PROTONIX     TAKE these medications   ibuprofen 600 MG tablet Commonly known as: ADVIL Take 1 tablet (600 mg total) by mouth every 6 (six) hours as needed.   PRENATAL VITAMIN PO Take by mouth.       Diet: routine diet  Activity: Advance as tolerated. Pelvic rest for 6 weeks.   Outpatient follow up:4 weeks  Follow up Appt:No future appointments. Follow up Visit: [place] for [facility]. Schedule an appointment as soon as possible for a  visit in 4 week(s).   Specialty: Obstetrics and Gynecology Why: for your postpartum appointment Contact information: [address] [place], [place] [id number] [place] [address] [phone]         Please schedule this patient for PP visit in: 4 weeks High risk pregnancy complicated by: high risk for Turner's syndrome, IUGR Delivery mode:  SVD Anticipated Birth Control:  Nexplanon; updated at d/c: unsure now PP Procedures needed: n/a  Schedule Integrated [place] visit: yes Provider: Any provider   Newborn Data: Live born female  Birth Weight: 2761 gm (6.b 1.4oz)  APGAR: 27, 9  Newborn Delivery   Birth date/time: 09/20/2018 20:38:00 Delivery type: Vaginal, Spontaneous      Baby Feeding: Bottle Disposition:per peds; infant currently undergoing eval for Turner Syndrome   09/22/2018 Myrtis Ser, CNM  10:40 AM

## 2018-09-20 NOTE — Anesthesia Procedure Notes (Signed)
Epidural Patient location during procedure: OB Start time: 09/20/2018 1:12 PM End time: 09/20/2018 1:33 PM  Staffing Anesthesiologist: Barnet Glasgow, MD Performed: anesthesiologist   Preanesthetic Checklist Completed: patient identified, site marked, surgical consent, pre-op evaluation, timeout performed, IV checked, risks and benefits discussed and monitors and equipment checked  Epidural Patient position: sitting Prep: site prepped and draped and DuraPrep Patient monitoring: continuous pulse ox and blood pressure Approach: midline Location: L2-L3 Injection technique: LOR air  Needle:  Needle type: Tuohy  Needle gauge: 17 G Needle length: 9 cm and 9 Needle insertion depth: 8 cm Catheter type: closed end flexible Catheter size: 19 Gauge Catheter at skin depth: 13 cm Test dose: negative  Assessment Events: blood not aspirated, injection not painful, no injection resistance, negative IV test and no paresthesia  Additional Notes Patient identified. Risks/Benefits/Options discussed with patient including but not limited to bleeding, infection, nerve damage, paralysis, failed block, incomplete pain control, headache, blood pressure changes, nausea, vomiting, reactions to medication both or allergic, itching and postpartum back pain. Confirmed with bedside nurse the patient's most recent platelet count. Confirmed with patient that they are not currently taking any anticoagulation, have any bleeding history or any family history of bleeding disorders. Patient expressed understanding and wished to proceed. All questions were answered. Sterile technique was used throughout the entire procedure. Please see nursing notes for vital signs. Test dose was given through epidural needle and negative prior to continuing to dose epidural or start infusion. Warning signs of high block given to the patient including shortness of breath, tingling/numbness in hands, complete motor block, or any  concerning symptoms with instructions to call for help. Patient was given instructions on fall risk and not to get out of bed. All questions and concerns addressed with instructions to call with any issues.1  Attempt (S) . Patient tolerated procedure well.

## 2018-09-20 NOTE — Progress Notes (Signed)
LABOR PROGRESS NOTE  Joanna Henry is a 23 y.o. G1P0 at [redacted]w[redacted]d admitted for IOL due to fetal growth restriction.   Subjective: Doing okay, feeling some contractions and breathing through them, continues to remain very anxious/apprehensive about cervical exams and the labor process.  Objective: BP 115/79   Pulse 85   Temp 98.1 F (36.7 C) (Oral)   Resp 18   Ht 5\' 6"  (1.676 m)   Wt 93.7 kg   LMP 01/16/2018 (Exact Date)   BMI 33.33 kg/m  or  Vitals:   09/20/18 0149 09/20/18 0512 09/20/18 0642 09/20/18 0745  BP: 119/76 119/73 116/75 115/79  Pulse: (!) 123 94 90 85  Resp: 20 20 18 18   Temp: 98.3 F (36.8 C)  98.1 F (36.7 C) 98.1 F (36.7 C)  TempSrc: Oral  Axillary Oral  Weight: 93.7 kg     Height: 5\' 6"  (1.676 m)      Dilation: Closed Effacement (%): Thick Presentation: Vertex(by ultrasound) Exam by:: SRussell, RN  FHT: Baseline rate 135, moderate varibility, +accels, occasional variable/late decels Toco: Contractions every 2-5 minutes  Labs: Lab Results  Component Value Date   WBC 8.0 09/20/2018   HGB 10.8 (L) 09/20/2018   HCT 31.9 (L) 09/20/2018   MCV 82.4 09/20/2018   PLT 368 09/20/2018    Patient Active Problem List   Diagnosis Date Noted  . Pregnancy affected by fetal growth restriction 09/18/2018  . Group B streptococcal bacteriuria 07/29/2018  . High risk for Monosomy X (per [place]) 07/01/2018  . Sickle cell trait ([place]) 05/29/2018  . Alpha thalassemia silent carrier 05/29/2018  . Supervision of high risk pregnancy, antepartum 03/23/2018  . Adjustment disorder with mixed disturbance of emotions and conduct 07/26/2015    Assessment / Plan: 23 y.o. G1P0 at [redacted]w[redacted]d here for IOL due to fetal growth restriction.   Labor: S/p Cytotec x1 for induction at 0320; now with more regular contractions. Difficulty tolerating cervical exam but noted to be closed and thick. Will repeat Cytotec and reassess in ~4 hours. Fetal Wellbeing: Category 2 tracing as above; will  continue to monitor. Pain Control: IV Fentanyl PRN for now and planning for early epidural; will consider PRN Ativan/Atarax to help with anxiety Anticipated MOD: SVD  Vilma Meckel, MD  Family Medicine, PGY-2 09/20/2018, 8:39 AM

## 2018-09-20 NOTE — H&P (Addendum)
LABOR AND DELIVERY ADMISSION HISTORY AND PHYSICAL NOTE  Joanna Henry is a 23 y.o. female G1P0 with IUP at 64w0dpresenting for IOL due to fetal growth restriction.  She reports positive fetal movement. She denies leakage of fluid or vaginal bleeding.  [facility]Pregnancy complications:  - Adjustment disorder with mixed disturbance of emotions and conduct - Sickle cell trait  - Alpha thalassemia silent carrier - High risk for Monosomy X (per cell free DNA; declined amniocentesis) - Group B streptococcal bacteriuria - Pregnancy affected by fetal growth restriction, EFW 2% at 33w with normal dopplers   Past Medical History: Past Medical History:  Diagnosis Date  . ADHD (attention deficit hyperactivity disorder)     Past Surgical History: Past Surgical History:  Procedure Laterality Date  . NO PAST SURGERIES      Obstetrical History: OB History    Gravida  1   Para      Term      Preterm      AB      Living        SAB      TAB      Ectopic      Multiple      Live Births              Social History: Social History   Socioeconomic History  . Marital status: Single    Spouse name: Not on file  . Number of children: Not on file  . Years of education: 12th grade  . Highest education level: 12th grade  Occupational History    Employer: [employer]  Social Needs  . Financial resource strain: Very hard  . Food insecurity    Worry: Often true    Inability: Often true  . Transportation needs    Medical: Yes    Non-medical: Yes  Tobacco Use  . Smoking status: Never Smoker  . Smokeless tobacco: Never Used  Substance and Sexual Activity  . Alcohol use: No  . Drug use: Not Currently    Types: Marijuana    Comment: daily when unable to eat  . Sexual activity: Yes    Birth control/protection: None    Comment: Skin irrigation from the patch  Lifestyle  . Physical activity    Days per week: 5 days    Minutes  per session: 30 min  . Stress: Only a little  Relationships  . Social connections    Talks on phone: More than three times a week    Gets together: Once a week    Attends religious service: 1 to 4 times per year    Active member of club or organization: No    Attends meetings of clubs or organizations: Never    Relationship status: Never married  Other Topics Concern  . Not on file  Social History Narrative   ** Merged History Encounter **   [facility] to [facility] for food stamps, [facility]        Family History: Family History  Problem Relation Age of Onset  . Diabetes Maternal Aunt   . Cancer Maternal Grandmother   . Diabetes Maternal Grandmother   . Cancer Paternal Grandmother   . Diabetes Paternal Grandmother     Allergies: Allergies  Allergen Reactions  . Fish Allergy Nausea And Vomiting    Medications Prior to Admission  Medication Sig Dispense Refill Last Dose  .  Accu-Chek FastClix Lancets MISC 1 Units by Percutaneous route 4 (four) times daily. 100 each 12   . AMBULATORY NON FORMULARY MEDICATION 1 Device by Other route 2 (two) times a week. Medication Name: Blood Pressure Cuff Monitored regularly at home. ICD O09.90 1 kit 0   . Blood Glucose Monitoring Suppl (ACCU-CHEK GUIDE) w/Device KIT 1 Units by Does not apply route 4 (four) times daily. 1 kit 0   . glucose blood (ACCU-CHEK GUIDE) test strip Use to check blood sugars four times a day was instructed 50 each 12   . hydrOXYzine (ATARAX/VISTARIL) 25 MG tablet Take 1 tablet (25 mg total) by mouth every 6 (six) hours as needed for up to 3 days for itching. 12 tablet 0   . pantoprazole (PROTONIX) 20 MG tablet Take 1 tablet (20 mg total) by mouth daily. 30 tablet 5   . Prenatal Vit-Fe Fumarate-FA (PRENATAL VITAMIN PO) Take by mouth.       Review of Systems  All systems reviewed and negative except as stated in HPI  Physical  Exam Blood pressure 119/76, pulse (!) 123, temperature 98.3 F (36.8 C), temperature source Oral, resp. rate 20, height '5\' 6"'  (1.676 m), weight 93.7 kg, last menstrual period 01/16/2018. General appearance: Alert, oriented, NAD Lungs: Normal respiratory effort Heart: Regular rate Abdomen: Soft, non-tender; gravid, FH appropriate for GA Extremities: No calf swelling or tenderness Presentation: Cephalic via bedside US Fetal monitoring: Baseline rate 130, moderate variability, +accels, -decels Uterine activity: Occasional contractions  Prenatal labs: ABO, Rh: --/--/O POS (07/19 0207) Antibody: NEG (07/19 0207) Rubella: 1.34 (01/20 1028) RPR: Non Reactive (06/04 0849)  HBsAg: Negative (01/20 1028)  HIV: Non Reactive (06/04 0849)  GC/Chlamydia: Negative (03/05 0000) GBS:  Positive in urine (5/25) 2-hr GTT: Declined Genetic screening: Abnormal; thickened NT, flattened nasal bridge on [place]; cell free fetal DNA screening with increased risk of Monosomy X Anatomy US: Fetal growth restriction, normal fetal echo  Prenatal Transfer Tool  Maternal Diabetes: No Genetic Screening: Abnormal:  Results: Other: As above Maternal Ultrasounds/Referrals: IUGR Fetal Ultrasounds or other Referrals:  Fetal echo, Referred to Materal Fetal Medicine  Maternal Substance Abuse:  No Significant Maternal Medications:  None Significant Maternal Lab Results: Group B Strep positive (bacteruria 07/27/2018)  Results for orders placed or performed during the hospital encounter of 09/20/18 (from the past 24 hour(s))  CBC   Collection Time: 09/20/18  2:07 AM  Result Value Ref Range   WBC 8.0 4.0 - 10.5 K/uL   RBC 3.87 3.87 - 5.11 MIL/uL   Hemoglobin 10.8 (L) 12.0 - 15.0 g/dL   HCT 31.9 (L) 36.0 - 46.0 %   MCV 82.4 80.0 - 100.0 fL   MCH 27.9 26.0 - 34.0 pg   MCHC 33.9 30.0 - 36.0 g/dL   RDW 12.6 11.5 - 15.5 %   Platelets 368 150 - 400 K/uL   nRBC 0.0 0.0 - 0.2 %  Type and screen   Collection Time: 09/20/18   2:07 AM  Result Value Ref Range   ABO/RH(D) O POS    Antibody Screen NEG    Sample Expiration      09/23/2018,2359 Performed at [facility], [address], [place], [place] [address]     Patient Active Problem List   Diagnosis Date Noted  . Pregnancy affected by fetal growth restriction 09/18/2018  . Group B streptococcal bacteriuria 07/29/2018  . High risk for Monosomy X (per [place]) 07/01/2018  . Sickle cell trait ([place]) 05/29/2018  .  Alpha thalassemia silent carrier 05/29/2018  . Supervision of high risk pregnancy, antepartum 03/23/2018  . Adjustment disorder with mixed disturbance of emotions and conduct 07/26/2015    Assessment: Joanna Henry is a 23 y.o. G1P0 at 36w0dhere for IOL for IUGR.   #Labor: Cervix closed, thick, and posterior in MAU on 7/16. Cephalic presentation confirmed via [place]. Will start induction with Cytotec and reassess in ~4 hours. #Pain: Planning for epidural #FWB: Category 1 tracing as above #ID: GBS positive; Penicillin ordered #MOF: Bottle #MOC: NBelle Rive7/19/2020, 3:54 AM   OB FELLOW HISTORY AND PHYSICAL ATTESTATION  I have seen and examined this patient; I agree with above documentation in the resident's note.   CPhill Myron D.O. OB Fellow  09/20/2018, 8:02 AM

## 2018-09-21 ENCOUNTER — Encounter (HOSPITAL_COMMUNITY): Payer: Self-pay

## 2018-09-21 LAB — CBC
HCT: 28.3 % — ABNORMAL LOW (ref 36.0–46.0)
Hemoglobin: 9.5 g/dL — ABNORMAL LOW (ref 12.0–15.0)
MCH: 27.6 pg (ref 26.0–34.0)
MCHC: 33.6 g/dL (ref 30.0–36.0)
MCV: 82.3 fL (ref 80.0–100.0)
Platelets: 261 10*3/uL (ref 150–400)
RBC: 3.44 MIL/uL — ABNORMAL LOW (ref 3.87–5.11)
RDW: 12.8 % (ref 11.5–15.5)
WBC: 8.1 10*3/uL (ref 4.0–10.5)
nRBC: 0 % (ref 0.0–0.2)

## 2018-09-21 LAB — ABO/RH: ABO/RH(D): O POS

## 2018-09-21 NOTE — Anesthesia Postprocedure Evaluation (Signed)
Anesthesia Post Note  Patient: Joanna Henry  Procedure(s) Performed: AN AD HOC LABOR EPIDURAL     Patient location during evaluation: Mother Baby Anesthesia Type: Epidural Level of consciousness: awake and alert and oriented Pain management: satisfactory to patient Vital Signs Assessment: post-procedure vital signs reviewed and stable Respiratory status: spontaneous breathing and nonlabored ventilation Cardiovascular status: stable Postop Assessment: no headache, no backache, no signs of nausea or vomiting, adequate PO intake, patient able to bend at knees and able to ambulate (patient up walking) Anesthetic complications: no    Last Vitals:  Vitals:   09/21/18 0330 09/21/18 0730  BP: 110/68 (!) 119/94  Pulse: 76 78  Resp: 18 18  Temp: 36.6 C (!) 36.4 C  SpO2:      Last Pain:  Vitals:   09/21/18 0730  TempSrc: Oral  PainSc: 0-No pain   Pain Goal:                   Aliou Mealey

## 2018-09-21 NOTE — Progress Notes (Signed)
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 CSW spoke with MD who stated results won't come back until infant is discharged. CSW met with [name] and FOB at bedside an additional time to provide SSI information and contact information for [employer]. CSW informed [name] and FOB that Lattie Haw with [employer] would likely be in touch in the next coming days. [name] and FOB expressed understanding.   Elijio Miles, [place]  Women's and [employer] [phone]

## 2018-09-21 NOTE — Progress Notes (Signed)
POSTPARTUM PROGRESS NOTE  Post Partum Day 1  Subjective:  Joanna Henry is a 23 y.o. G1P1001 s/p SVD at [redacted]w[redacted]d.  She reports she is doing well. She is ambulating around the room. No acute events overnight. She denies any problems with ambulating, voiding or po intake. Denies nausea or vomiting.  Pain is well controlled.  Lochia has decreased and is appropriate. She had some concern about the baby's feet edema which is improving.  Objective: Blood pressure 110/68, pulse 76, temperature 97.8 F (36.6 C), temperature source Oral, resp. rate 18, height 5\' 6"  (1.676 m), weight 93.7 kg, last menstrual period 01/16/2018, SpO2 97 %, unknown if currently breastfeeding.  Physical Exam:  General: alert, cooperative and no distress Chest: no respiratory distress Heart: distal pulses intact Abdomen: soft, nontender Uterine Fundus: firm, appropriately tender DVT Evaluation: No calf swelling or tenderness Extremities: No edema Skin: warm, dry  Recent Labs    09/20/18 0207 09/21/18 0351  HGB 10.8* 9.5*  HCT 31.9* 28.3*    Assessment/Plan: Joanna Henry is a 23 y.o. G1P1001 s/p SVD at [redacted]w[redacted]d   PPD# 1 - Doing well Routine postpartum care Contraception: Nexplanon Feeding: Bottle Dispo: Plan for discharge home 09/22/18.   LOS: 1 day   [employer], D.O. Family Medicine Resident, PGY-1  09/21/2018, 7:48 AM

## 2018-09-21 NOTE — Clinical Social Work Maternal (Signed)
CLINICAL SOCIAL WORK MATERNAL/CHILD NOTE  Patient Details  Name: Joanna Henry MRN: [id number] Date of Birth: [birth year]  Date:  09/21/2018  Clinical Social Worker Initiating Note:  Elijio Miles Date/Time: Initiated:  09/21/18/0944     Child's Name:  Joanna Henry   Biological Parents:  Mother, Father(Joanna Henry and Joanna Henry DOB: 06/04/1996)   Need for Interpreter:  None   Reason for Referral:  Behavioral Health Concerns, Current Substance Use/Substance Use During Pregnancy    Address:  [address] [place] [address]    Phone number:  [phone] (home)     Additional phone number:   Household Members/Support Persons (HM/SP):   Household Member/Support Person 1, Household Member/Support Person 2, Household Member/Support Person 3, Household Member/Support Person 4, Household Member/Support Person 5   HM/SP Name Relationship DOB or Age  HM/SP -1 IT sales professional Mother    HM/SP -2   Aunt    HM/SP -3   Brother    HM/SP -4   Sister    HM/SP -5   Sister    HM/SP -6        HM/SP -7        HM/SP -8          Natural Supports (not living in the home):  Friends, Immediate Family, Extended Family   Professional Supports: None   Employment: Unemployed   Type of Work:     Education:  Programmer, systems   Homebound arranged:    Museum/gallery curator Resources:  Medicaid   Other Resources:  Physicist, medical , [place] Considerations Which May Impact Care:    Strengths:  Ability to meet basic needs , Home prepared for child , Pediatrician chosen   Psychotropic Medications:         Pediatrician:    [place] area  Pediatrician List:   [facility] for [place]      Pediatrician Fax Number:    Risk Factors/Current Problems:  Mental Health Concerns , Substance Use    Cognitive State:  Able to Concentrate , Alert , Linear  Thinking    Mood/Affect:  Bright , Calm , Comfortable , Relaxed , Interested    CSW Assessment:   CSW received consult for moderate/severe anxiety and high risk for PPD. Through chart review, also aware [name] used THC during pregnancy. CSW met with [name] to offer support and complete assessment.    [name] sitting up in bed holding infant to chest and appeared to be well-bonded to infant as evidenced by loving looks and praise towards child. FOB also present and at bedside, when CSW entered the room. CSW introduced self and received verbal permission from [name] to have FOB step out to complete assessment. FOB left voluntarily. CSW explained reason for consult to which [name] expressed understanding. [name] currently lives with her mother, aunt, brother and 2 sisters. [name] reported she is not currently employed but receives [facility] and food stamps and is aware of process to get infant added on to her plans.   CSW inquired about [name]'s mental health history and [name] acknowledged having a history of anxiety starting in 5th grade. [name] reported it began with test anxiety and further progressed into social anxiety and stress. [name] denied being on any medications currently or receiving counseling. [name] reported "she knows she can't be stressed now that she has this angel  here to comfort her". CSW offered [name] counseling resources but [name] declined at this time. CSW strongly encouraged [name] to reach out to obgyn if she notices symptoms arise to which [name] was agreeable. CSW provided education regarding the baby blues period vs. perinatal mood disorders and discussed treatment. CSW recommends self-evaluation during the postpartum time period using the New Mom Checklist from Postpartum Progress and encouraged [name] to contact a medical professional if symptoms are noted at any time. [name] denied any current SI, HI or DV and reported having a strong support system consisting of her best friend, best friend's father, [name]'s mother, [name]'s  grandmother and FOB.   CSW inquired about [name]'s substance use during pregnancy and [name] acknowledged using THC in the beginning of her pregnancy to help with her nausea and appetite. Per [name], she met with a doctor who encouraged her to stop use and prescribed her medications to help with symptoms. CSW informed [name] of Hospital Drug Policy and explained UDS and CDS were still pending but that a CPS report would be made, if warranted. [name] denied any questions or concerns regarding policy or report.   [name] confirmed having all essential items for infant once discharged and reported infant would be sleeping in a basinet once home. CSW provided review of Sudden Infant Death Syndrome (SIDS) precautions and safe sleeping habits.    [name] denied any further questions, concerns or need for resources from CSW at this time. CSW to continue to monitor UDS and CDS and make report, if necessary.   CSW Plan/Description:  No Further Intervention Required/No Barriers to Discharge, Sudden Infant Death Syndrome (SIDS) Education, Perinatal Mood and Anxiety Disorder (PMADs) Education, [place], CSW Will Continue to Monitor Umbilical Cord Tissue Drug Screen Results and Make Report if Foye Spurling, [place] 09/21/2018, 10:29 AM

## 2018-09-22 LAB — GC/CHLAMYDIA PROBE AMP (~~LOC~~) NOT AT ARMC
Chlamydia: NEGATIVE
Neisseria Gonorrhea: NEGATIVE

## 2018-09-22 MED ORDER — IBUPROFEN 600 MG PO TABS: 600.0000 mg | ORAL_TABLET | Freq: Four times a day (QID) | ORAL | 0 refills | Status: DC | PRN

## 2018-09-22 NOTE — Discharge Instructions (Signed)

## 2018-09-25 ENCOUNTER — Telehealth: Payer: Medicaid Other | Admitting: Family Medicine

## 2018-10-02 ENCOUNTER — Telehealth: Payer: Medicaid Other | Admitting: Obstetrics & Gynecology

## 2018-10-06 ENCOUNTER — Other Ambulatory Visit: Payer: Self-pay

## 2018-10-06 ENCOUNTER — Telehealth: Payer: Medicaid Other | Admitting: Clinical

## 2018-10-06 DIAGNOSIS — Z8669 Personal history of other diseases of the nervous system and sense organs: Secondary | ICD-10-CM

## 2018-10-06 DIAGNOSIS — Z8659 Personal history of other mental and behavioral disorders: Secondary | ICD-10-CM

## 2018-10-06 NOTE — BH Specialist Note (Signed)
Pt forgot about this visit today, expresses how much she is loving being a mother and taking care of her daughter, and says she is really tired right now, as she and newborn daughter just woke up, says she is taking turns with grandmother and FOB taking care of baby at night, and is very amenable to a virtual visit with [facility] tomorrow at 2:45pm.   [place] via Telemedicine Telephone Visit  10/06/2018 Joanna Henry [id number]  Number of [place] visits: 1 Session Start time: 1:18  Session End time: 1:29 Total time: 11 minutes  Referring Provider: Serita Grammes, CNM for postpartum mood check Type of Visit: Video Patient/Family location: Home [facility] Provider location: [facility] All persons participating in visit: [name] and [place]   I connected with Joanna Henry and/or Joanna Henry' Patient and/or legal guardian expressed understanding and consented to video visit: Pt requests to reschedule visit for tomorrow, 8/5  [place]

## 2018-10-06 NOTE — BH Specialist Note (Addendum)
Integrated Behavioral Health via Telemedicine Video Visit  10/06/2018 Joanna Henry [id number]  Number of [place] visits: 1 Session Start time: 2:47  Session End time: 3:09 Total time: 20 minutes  Referring Provider: Serita Grammes, CNM  Type of Visit: Video Patient/Family location: Home [facility] Provider location: [facility] All persons participating in visit: [name] and [place]  Confirmed patient's address: Yes  Confirmed patient's phone number: Yes  Any changes to demographics: No   Confirmed patient's insurance: Yes  Any changes to patient's insurance: No   Discussed confidentiality: Yes   I connected with Myrla Hodgens  by a video enabled telemedicine application and verified that I am speaking with the correct person using two identifiers.     I discussed the limitations of evaluation and management by telemedicine and the availability of in person appointments.  I discussed that the purpose of this visit is to provide behavioral health care while limiting exposure to the novel coronavirus.   Discussed there is a possibility of technology failure and discussed alternative modes of communication if that failure occurs.  I discussed that engaging in this video visit, they consent to the provision of behavioral healthcare and the services will be billed under their insurance.  Patient and/or legal guardian expressed understanding and consented to video visit: Yes   PRESENTING CONCERNS: Patient and/or family reports the following symptoms/concerns: Pt states she is doing well adjusting to new motherhood, is sleeping and eating well, expresses great support from friends, family, and FOB, and is proud that baby is doing better than expected. Pt is uncertain about Nexplanon or not for birth control, and is open to finding out about new mom support groups.  Duration of problem: Postpartum; Severity of problem: mild  STRENGTHS (Protective  Factors/Coping Skills): Strong social support  GOALS ADDRESSED: Patient will: 1.  Increase knowledge and/or ability of: healthy habits  2.  Demonstrate ability to: Increase healthy adjustment to current life circumstances  INTERVENTIONS: Interventions utilized:  Psychoeducation and/or Health Education and Link to [employer] Standardized Assessments completed: GAD-7 and PHQ 9  ASSESSMENT: Patient currently experiencing History of adjustment disorder.  Patient may benefit from psychoeducation and brief therapeutic interventions regarding maintaining healthy adjustment to new motherhood.  Marland Kitchen  PLAN: 1. Follow up with behavioral health clinician on : As needed 2. Behavioral recommendations:  -Continue taking prenatal vitamin until postpartum visit; prioritizing healthy eating and sleeping, and accepting support from helpful friends, family, and FOB -Consider registering for online new mom group, for additional support 3. Referral(s): [place] (In Clinic) and [employer]:  new mom support  I discussed the assessment and treatment plan with the patient and/or parent/guardian. They were provided an opportunity to ask questions and all were answered. They agreed with the plan and demonstrated an understanding of the instructions.   They were advised to call back or seek an in-person evaluation if the symptoms worsen or if the condition fails to improve as anticipated.  Caroleen Hamman Keanthony Poole  Depression screen [facility] 2/9 10/07/2018 08/05/2018  Decreased Interest 0 0  Down, Depressed, Hopeless 0 0  PHQ - 2 Score 0 0  Altered sleeping 1 1  Tired, decreased energy 0 0  Change in appetite 0 0  Feeling bad or failure about yourself  0 0  Trouble concentrating 0 0  Moving slowly or fidgety/restless 0 0  Suicidal thoughts 0 0  PHQ-9 Score 1 1   GAD 7 : Generalized Anxiety Score 10/07/2018 08/05/2018  Nervous, Anxious, on Edge 0 0  Control/stop worrying 0 0   Worry too much - different things 0 0  Trouble relaxing 0 1  Restless 0 1  Easily annoyed or irritable 0 1  Afraid - awful might happen 0 0  Total GAD 7 Score 0 3

## 2018-10-07 ENCOUNTER — Other Ambulatory Visit: Payer: Self-pay

## 2018-10-07 ENCOUNTER — Telehealth (INDEPENDENT_AMBULATORY_CARE_PROVIDER_SITE_OTHER): Payer: Medicaid Other | Admitting: Clinical

## 2018-10-07 DIAGNOSIS — Z8669 Personal history of other diseases of the nervous system and sense organs: Secondary | ICD-10-CM | POA: Diagnosis not present

## 2018-10-07 DIAGNOSIS — Z8659 Personal history of other mental and behavioral disorders: Secondary | ICD-10-CM

## 2018-10-09 ENCOUNTER — Telehealth: Payer: Medicaid Other | Admitting: Family Medicine

## 2018-10-16 ENCOUNTER — Encounter: Payer: Self-pay | Admitting: Medical

## 2018-10-16 ENCOUNTER — Telehealth (INDEPENDENT_AMBULATORY_CARE_PROVIDER_SITE_OTHER): Payer: Medicaid Other | Admitting: Medical

## 2018-10-16 ENCOUNTER — Other Ambulatory Visit: Payer: Self-pay

## 2018-10-16 DIAGNOSIS — R1030 Lower abdominal pain, unspecified: Secondary | ICD-10-CM

## 2018-10-16 DIAGNOSIS — Z1389 Encounter for screening for other disorder: Secondary | ICD-10-CM | POA: Diagnosis not present

## 2018-10-16 MED ORDER — IBUPROFEN 600 MG PO TABS: 600.0000 mg | ORAL_TABLET | Freq: Four times a day (QID) | ORAL | 0 refills | Status: DC | PRN

## 2018-10-16 MED ORDER — IBUPROFEN 800 MG PO TABS: 800.0000 mg | ORAL_TABLET | Freq: Three times a day (TID) | ORAL | 0 refills | Status: DC

## 2018-10-16 NOTE — Progress Notes (Signed)
I connected with Joanna Henry on 10/16/18 at  8:55 AM EDT by: MyChart video and verified that I am speaking with the correct person using two identifiers.  Patient is located at home and provider is located at home.     The purpose of this virtual visit is to provide medical care while limiting exposure to the novel coronavirus. I discussed the limitations, risks, security and privacy concerns of performing an evaluation and management service by MyChart video and the availability of in person appointments. I also discussed with the patient that there may be a patient responsible charge related to this service. By engaging in this virtual visit, you consent to the provision of healthcare.  Additionally, you authorize for your insurance to be billed for the services provided during this visit.  The patient expressed understanding and agreed to proceed.  The following staff members participated in the virtual visit:  Carver Fila, Nitro Partum Visit Note Subjective:    Ms. Joanna Henry is a 23 y.o. G32P1001 female who presents for a postpartum visit. She is 4 weeks postpartum following a spontaneous vaginal delivery. I have fully reviewed the prenatal and intrapartum course. The delivery was at 31 gestational weeks due to IUGR. Outcome: spontaneous vaginal delivery. Anesthesia: epidural. Postpartum course has been normal. Baby's course has been normal. Baby is feeding by bottle - [employer] Good Start. Bleeding thin lochia. Bowel function is normal. Bladder function is normal. Patient is not sexually active. Contraception method is abstinence. Postpartum depression screening: negative.  Having strong cramping since leaving the hospital. Has not taken any pain medication recently. Didn't feel that 600 mg Ibuprofen was helping much. Pain comes and goes. Rated at 8/10 when present. More likely when at rest.   The following portions of the patient's history were reviewed and updated as appropriate: allergies,  current medications, past family history, past medical history, past social history, past surgical history and problem list.  Review of Systems Pertinent items are noted in HPI.   Objective:  There were no vitals filed for this visit. Self-Obtained       Assessment:    Normal postpartum exam. Pap smear not done at today's visit. Last pap smear N/A.  Next pap due this year.   Intermittent lower abdominal ibuprofen   Plan:   1. Contraception: condoms 2. Rx for 800 mg Ibuprofen sent to patient's pharmacy  3.  Follow up in: 8 weeks or as needed.   Luvenia Redden, PA-C 10/16/2018 9:06 AM

## 2018-10-16 NOTE — Patient Instructions (Addendum)
Pap Test Why am I having this test? A Pap test, also called a Pap smear, is a screening test to check for signs of:  Cancer of the vagina, cervix, and uterus. The cervix is the lower part of the uterus that opens into the vagina.  Infection.  Changes that may be a sign that cancer is developing (precancerous changes). Women need this test on a regular basis. In general, you should have a Pap test every 3 years until you reach menopause or age 21. Women aged 30-60 may choose to have their Pap test done at the same time as an HPV (human papillomavirus) test every 5 years (instead of every 3 years). Your health care provider may recommend having Pap tests more or less often depending on your medical conditions and past Pap test results. What kind of sample is taken?  Your health care provider will collect a sample of cells from the surface of your cervix. This will be done using a small cotton swab, plastic spatula, or brush. This sample is often collected during a pelvic exam, when you are lying on your back on an exam table with feet in footrests (stirrups). In some cases, fluids (secretions) from the cervix or vagina may also be collected. How do I prepare for this test?  Be aware of where you are in your menstrual cycle. If you are menstruating on the day of the test, you may be asked to reschedule.  You may need to reschedule if you have a known vaginal infection on the day of the test.  Follow instructions from your health care provider about: ? Changing or stopping your regular medicines. Some medicines can cause abnormal test results, such as digitalis and tetracycline. ? Avoiding douching or taking a bath the day before or the day of the test. Tell a health care provider about:  Any allergies you have.  All medicines you are taking, including vitamins, herbs, eye drops, creams, and over-the-counter medicines.  Any blood disorders you have.  Any surgeries you have had.  Any  medical conditions you have.  Whether you are pregnant or may be pregnant. How are the results reported? Your test results will be reported as either abnormal or normal. A false-positive result can occur. A false positive is incorrect because it means that a condition is present when it is not. A false-negative result can occur. A false negative is incorrect because it means that a condition is not present when it is. What do the results mean? A normal test result means that you do not have signs of cancer of the vagina, cervix, or uterus. An abnormal result may mean that you have:  Cancer. A Pap test by itself is not enough to diagnose cancer. You will have more tests done in this case.  Precancerous changes in your vagina, cervix, or uterus.  Inflammation of the cervix.  An STD (sexually transmitted disease).  A fungal infection.  A parasite infection. Talk with your health care provider about what your results mean. Questions to ask your health care provider Ask your health care provider, or the department that is doing the test:  When will my results be ready?  How will I get my results?  What are my treatment options?  What other tests do I need?  What are my next steps? Summary  In general, women should have a Pap test every 3 years until they reach menopause or age 21.  Your health care provider will collect a   sample of cells from the surface of your cervix. This will be done using a small cotton swab, plastic spatula, or brush.  In some cases, fluids (secretions) from the cervix or vagina may also be collected. This information is not intended to replace advice given to you by your health care provider. Make sure you discuss any questions you have with your health care provider. Document Released: 05/11/2002 Document Revised: 10/28/2016 Document Reviewed: 10/28/2016 Elsevier Patient Education  2020 [place].  Postpartum Care After Vaginal Delivery This  sheet gives you information about how to care for yourself from the time you deliver your baby to up to 6-12 weeks after delivery (postpartum period). Your health care provider may also give you more specific instructions. If you have problems or questions, contact your health care provider. Follow these instructions at home: Vaginal bleeding It is normal to have vaginal bleeding (lochia) after delivery. Wear a sanitary pad for vaginal bleeding and discharge. During the first week after delivery, the amount and appearance of lochia is often similar to a menstrual period. Over the next few weeks, it will gradually decrease to a dry, yellow-brown discharge. For most women, lochia stops completely by 4-6 weeks after delivery. Vaginal bleeding can vary from woman to woman. Change your sanitary pads frequently. Watch for any changes in your flow, such as: A sudden increase in volume. A change in color. Large blood clots. If you pass a blood clot from your vagina, save it and call your health care provider to discuss. Do not flush blood clots down the toilet before talking with your health care provider. Do not use tampons or douches until your health care provider says this is safe. If you are not breastfeeding, your period should return 6-8 weeks after delivery. If you are feeding your child breast milk only (exclusive breastfeeding), your period may not return until you stop breastfeeding. Perineal care Keep the area between the vagina and the anus (perineum) clean and dry as told by your health care provider. Use medicated pads and pain-relieving sprays and creams as directed. If you had a cut in the perineum (episiotomy) or a tear in the vagina, check the area for signs of infection until you are healed. Check for: More redness, swelling, or pain. Fluid or blood coming from the cut or tear. Warmth. Pus or a bad smell. You may be given a squirt bottle to use instead of wiping to clean the perineum  area after you go to the bathroom. As you start healing, you may use the squirt bottle before wiping yourself. Make sure to wipe gently. To relieve pain caused by an episiotomy, a tear in the vagina, or swollen veins in the anus (hemorrhoids), try taking a warm sitz bath 2-3 times a day. A sitz bath is a warm water bath that is taken while you are sitting down. The water should only come up to your hips and should cover your buttocks. Breast care Within the first few days after delivery, your breasts may feel heavy, full, and uncomfortable (breast engorgement). Milk may also leak from your breasts. Your health care provider can suggest ways to help relieve the discomfort. Breast engorgement should go away within a few days. If you are breastfeeding: Wear a bra that supports your breasts and fits you well. Keep your nipples clean and dry. Apply creams and ointments as told by your health care provider. You may need to use breast pads to absorb milk that leaks from your breasts. You  may have uterine contractions every time you breastfeed for up to several weeks after delivery. Uterine contractions help your uterus return to its normal size. If you have any problems with breastfeeding, work with your health care provider or Advertising copywriterlactation consultant. If you are not breastfeeding: Avoid touching your breasts a lot. Doing this can make your breasts produce more milk. Wear a good-fitting bra and use cold packs to help with swelling. Do not squeeze out (express) milk. This causes you to make more milk. Intimacy and sexuality Ask your health care provider when you can engage in sexual activity. This may depend on: Your risk of infection. How fast you are healing. Your comfort and desire to engage in sexual activity. You are able to get pregnant after delivery, even if you have not had your period. If desired, talk with your health care provider about methods of birth control (contraception). Medicines Take  over-the-counter and prescription medicines only as told by your health care provider. If you were prescribed an antibiotic medicine, take it as told by your health care provider. Do not stop taking the antibiotic even if you start to feel better. Activity Gradually return to your normal activities as told by your health care provider. Ask your health care provider what activities are safe for you. Rest as much as possible. Try to rest or take a nap while your baby is sleeping. Eating and drinking  Drink enough fluid to keep your urine pale yellow. Eat high-fiber foods every day. These may help prevent or relieve constipation. High-fiber foods include: Whole grain cereals and breads. Brown rice. Beans. Fresh fruits and vegetables. Do not try to lose weight quickly by cutting back on calories. Take your prenatal vitamins until your postpartum checkup or until your health care provider tells you it is okay to stop. Lifestyle Do not use any products that contain nicotine or tobacco, such as cigarettes and e-cigarettes. If you need help quitting, ask your health care provider. Do not drink alcohol, especially if you are breastfeeding. General instructions Keep all follow-up visits for you and your baby as told by your health care provider. Most women visit their health care provider for a postpartum checkup within the first 3-6 weeks after delivery. Contact a health care provider if: You feel unable to cope with the changes that your child brings to your life, and these feelings do not go away. You feel unusually sad or worried. Your breasts become red, painful, or hard. You have a fever. You have trouble holding urine or keeping urine from leaking. You have little or no interest in activities you used to enjoy. You have not breastfed at all and you have not had a menstrual period for 12 weeks after delivery. You have stopped breastfeeding and you have not had a menstrual period for 12 weeks  after you stopped breastfeeding. You have questions about caring for yourself or your baby. You pass a blood clot from your vagina. Get help right away if: You have chest pain. You have difficulty breathing. You have sudden, severe leg pain. You have severe pain or cramping in your lower abdomen. You bleed from your vagina so much that you fill more than one sanitary pad in one hour. Bleeding should not be heavier than your heaviest period. You develop a severe headache. You faint. You have blurred vision or spots in your vision. You have bad-smelling vaginal discharge. You have thoughts about hurting yourself or your baby. If you ever feel like you may  hurt yourself or others, or have thoughts about taking your own life, get help right away. You can go to the nearest emergency department or call: Your local emergency services (911 in the [place] A suicide crisis helpline, such as the National Suicide Prevention Lifeline at 762-221-68641-(817)135-6004. This is open 24 hours a day. Summary The period of time right after you deliver your newborn up to 6-12 weeks after delivery is called the postpartum period. Gradually return to your normal activities as told by your health care provider. Keep all follow-up visits for you and your baby as told by your health care provider. This information is not intended to replace advice given to you by your health care provider. Make sure you discuss any questions you have with your health care provider. Document Released: 12/16/2006 Document Revised: 02/21/2017 Document Reviewed: 12/02/2016 Elsevier Patient Education  2020 [employer]

## 2018-10-16 NOTE — Progress Notes (Signed)
Pt states does not want any BC.

## 2018-10-21 ENCOUNTER — Ambulatory Visit: Payer: Medicaid Other | Admitting: Medical

## 2018-11-06 ENCOUNTER — Encounter: Payer: Self-pay | Admitting: *Deleted

## 2018-12-16 ENCOUNTER — Encounter: Payer: Self-pay | Admitting: Family Medicine

## 2018-12-16 ENCOUNTER — Ambulatory Visit: Payer: Medicaid Other | Admitting: Medical

## 2019-01-20 ENCOUNTER — Ambulatory Visit: Payer: Medicaid Other | Admitting: Medical

## 2019-02-08 ENCOUNTER — Ambulatory Visit: Payer: Medicaid Other | Admitting: Obstetrics and Gynecology

## 2019-02-08 ENCOUNTER — Encounter: Payer: Self-pay | Admitting: Obstetrics and Gynecology

## 2019-04-27 ENCOUNTER — Other Ambulatory Visit: Payer: Self-pay

## 2019-04-27 ENCOUNTER — Encounter (HOSPITAL_COMMUNITY): Payer: Self-pay

## 2019-04-27 ENCOUNTER — Ambulatory Visit (HOSPITAL_COMMUNITY)
Admission: EM | Admit: 2019-04-27 | Discharge: 2019-04-27 | Disposition: A | Discharge status: Home / Self Care | Payer: Medicaid Other | Attending: Family Medicine | Admitting: Family Medicine

## 2019-04-27 DIAGNOSIS — Z3201 Encounter for pregnancy test, result positive: Secondary | ICD-10-CM | POA: Insufficient documentation

## 2019-04-27 LAB — HCG, QUANTITATIVE, PREGNANCY: hCG, Beta Chain, Quant, S: 63168 m[IU]/mL — ABNORMAL HIGH (ref <5)

## 2019-04-27 LAB — POC URINE PREG, ED: Preg Test, Ur: POSITIVE — AB

## 2019-04-27 LAB — POCT PREGNANCY, URINE: Preg Test, Ur: POSITIVE — AB

## 2019-04-27 NOTE — ED Triage Notes (Signed)
Pt reports she is having nausea in the morning x 1 month. Pt reports she is not feeling herself since her baby was born 6 months ago. Pt reports she can not be pregnant as she is not currently having sex.

## 2019-04-27 NOTE — Discharge Instructions (Addendum)
We have sent a blood pregnancy test to confirm the positive urine test done here. We will call you with the results.

## 2019-04-28 NOTE — ED Provider Notes (Signed)
[facility]   [id number] 04/27/19 Arrival Time: 4196  ASSESSMENT & PLAN:  1. Positive pregnancy test     Addendum: I informed patient's boyfriend of quant HCG; she gave me verbal permission at visit to leave results with him.  Benign abdominal exam. No indications for urgent abdominal/pelvic imaging at this time. Discussed.    Discharge Instructions     We have sent a blood pregnancy test to confirm the positive urine test done here. We will call you with the results.   Follow-up Information    Schedule an appointment as soon as possible for a visit  with [facility].   Specialty: Obstetrics and Gynecology Contact information: [address] [place], [place] [id number] [place] [address] [phone]          Reviewed expectations re: course of current medical issues. Questions answered. Outlined signs and symptoms indicating need for more acute intervention. Patient verbalized understanding. After Visit Summary given.   SUBJECTIVE: History from: patient. Joanna Henry is a 24 y.o. female who requests pregnancy test. Feeling nausea in morning for past few weeks. No emesis. No abdominal pain. Reports that she is not sexually active. No urinary symptoms. "Just don't feel myself". Afebrile. No recent illnesses.  OB History    Gravida  1   Para  1   Term  1   Preterm      AB      Living  1     SAB      TAB      Ectopic      Multiple  0   Live Births  1           No LMP recorded. She is unsure of LMP.  Past Surgical History:  Procedure Laterality Date  . NO PAST SURGERIES       OBJECTIVE:  Vitals:   04/27/19 1703  BP: 126/82  Pulse: 86  Resp: 18  Temp: 98.8 F (37.1 C)  TempSrc: Oral  SpO2: 97%    General appearance: alert, oriented, no acute distress HEENT: [place]; AT; oropharynx moist Lungs: unlabored respirations Abdomen: soft; without distention; no  specific tenderness to palpation Back: without reported CVA tenderness; FROM at waist Extremities: without LE edema; symmetrical; without gross deformities Skin: warm and dry Neurologic: normal gait Psychological: alert and cooperative; normal mood and affect  Labs: UPT positive.   Allergies  Allergen Reactions  . Fish Allergy Nausea And Vomiting                                               Past Medical History:  Diagnosis Date  . ADHD (attention deficit hyperactivity disorder)     Social History   Socioeconomic History  . Marital status: Single    Spouse name: Not on file  . Number of children: Not on file  . Years of education: 12th grade  . Highest education level: 12th grade  Occupational History    Employer: [employer]  Tobacco Use  . Smoking status: Never Smoker  . Smokeless tobacco: Never Used  Substance and Sexual Activity  . Alcohol use: No  . Drug use: Not Currently    Types: Marijuana    Comment: daily when unable to eat  . Sexual activity: Yes    Birth control/protection: None    Comment: Skin  irrigation from the patch  Other Topics Concern  . Not on file  Social History Narrative   ** Merged History Encounter **   Patient advised to get an appointment with [facility] and social services to apply for food stamps, set up medical transportation and [facility].       Social Determinants of Health   Financial Resource Strain:   . Difficulty of Paying Living Expenses: Not on file  Food Insecurity:   . Worried About Programme researcher, broadcasting/film/video in the Last Year: Not on file  . Ran Out of Food in the Last Year: Not on file  Transportation Needs:   . Lack of Transportation (Medical): Not on file  . Lack of Transportation (Non-Medical): Not on file  Physical Activity:   . Days of Exercise per Week: Not on file  . Minutes of Exercise per Session: Not on file  Stress:   . Feeling of Stress : Not on file  Social Connections:   . Frequency of Communication with Friends and  Family: Not on file  . Frequency of Social Gatherings with Friends and Family: Not on file  . Attends Religious Services: Not on file  . Active Member of Clubs or Organizations: Not on file  . Attends Banker Meetings: Not on file  . Marital Status: Not on file  Intimate Partner Violence:   . Fear of Current or Ex-Partner: Not on file  . Emotionally Abused: Not on file  . Physically Abused: Not on file  . Sexually Abused: Not on file    Family History  Problem Relation Age of Onset  . Diabetes Maternal Aunt   . Cancer Maternal Grandmother   . Diabetes Maternal Grandmother   . Cancer Paternal Grandmother   . Diabetes Paternal Dulce Sellar, MD 04/28/19 [phone]

## 2019-07-24 ENCOUNTER — Other Ambulatory Visit: Payer: Self-pay

## 2019-07-24 ENCOUNTER — Encounter (HOSPITAL_COMMUNITY): Payer: Self-pay

## 2019-07-24 ENCOUNTER — Emergency Department (HOSPITAL_COMMUNITY)
Admission: EM | Admit: 2019-07-24 | Discharge: 2019-07-24 | Disposition: A | Discharge status: Home / Self Care | Payer: Medicaid Other | Attending: Emergency Medicine | Admitting: Emergency Medicine

## 2019-07-24 ENCOUNTER — Emergency Department (HOSPITAL_COMMUNITY): Payer: Medicaid Other

## 2019-07-24 DIAGNOSIS — M542 Cervicalgia: Secondary | ICD-10-CM | POA: Diagnosis present

## 2019-07-24 DIAGNOSIS — Y9389 Activity, other specified: Secondary | ICD-10-CM | POA: Insufficient documentation

## 2019-07-24 DIAGNOSIS — M62838 Other muscle spasm: Secondary | ICD-10-CM | POA: Insufficient documentation

## 2019-07-24 DIAGNOSIS — Y999 Unspecified external cause status: Secondary | ICD-10-CM | POA: Diagnosis not present

## 2019-07-24 DIAGNOSIS — Y92410 Unspecified street and highway as the place of occurrence of the external cause: Secondary | ICD-10-CM | POA: Diagnosis not present

## 2019-07-24 DIAGNOSIS — S199XXA Unspecified injury of neck, initial encounter: Secondary | ICD-10-CM | POA: Diagnosis not present

## 2019-07-24 DIAGNOSIS — Z79899 Other long term (current) drug therapy: Secondary | ICD-10-CM | POA: Diagnosis not present

## 2019-07-24 MED ORDER — NAPROXEN 500 MG PO TABS: 500.0000 mg | ORAL_TABLET | Freq: Two times a day (BID) | ORAL | 0 refills | Status: DC

## 2019-07-24 MED ORDER — CYCLOBENZAPRINE HCL 5 MG PO TABS: 5.0000 mg | ORAL_TABLET | Freq: Two times a day (BID) | ORAL | 0 refills | Status: DC | PRN
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
## 2019-07-24 NOTE — ED Notes (Signed)
Patient verbalizes understanding of discharge instructions. Opportunity for questioning and answers were provided. Armband removed by staff, pt discharged from ED.  
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
## 2020-01-19 ENCOUNTER — Encounter: Payer: Self-pay | Admitting: Family

## 2020-01-20 ENCOUNTER — Ambulatory Visit: Payer: Medicaid Other | Admitting: Family

## 2020-01-20 ENCOUNTER — Telehealth (INDEPENDENT_AMBULATORY_CARE_PROVIDER_SITE_OTHER): Payer: Medicaid Other | Admitting: Family

## 2020-01-20 ENCOUNTER — Encounter: Payer: Self-pay | Admitting: Family

## 2020-01-20 ENCOUNTER — Other Ambulatory Visit: Payer: Self-pay

## 2020-01-20 DIAGNOSIS — N920 Excessive and frequent menstruation with regular cycle: Secondary | ICD-10-CM

## 2020-01-20 DIAGNOSIS — Z30011 Encounter for initial prescription of contraceptive pills: Secondary | ICD-10-CM

## 2020-01-20 DIAGNOSIS — N946 Dysmenorrhea, unspecified: Secondary | ICD-10-CM | POA: Diagnosis not present

## 2020-01-20 MED ORDER — NORETHINDRONE ACET-ETHINYL EST 1.5-30 MG-MCG PO TABS: 1.0000 | ORAL_TABLET | Freq: Every day | ORAL | 3 refills | Status: DC

## 2020-01-20 NOTE — Progress Notes (Signed)
THIS RECORD MAY CONTAIN CONFIDENTIAL INFORMATION THAT SHOULD NOT BE RELEASED WITHOUT REVIEW OF THE SERVICE PROVIDER.  Virtual Visit via Video Note  I connected with Joanna Henry   on 01/20/20 at 10:30 AM EST by a video enabled telemedicine application and verified that I am speaking with the correct person using two identifiers.   Location of patient/parent: home   I discussed the limitations of evaluation and management by telemedicine and the availability of in person appointments.  I discussed that the purpose of this telehealth visit is to provide medical care while limiting exposure to the novel coronavirus.  The patient expressed understanding and agreed to proceed.  Chief Complaint:  Birth Control options   Joanna Henry is a 24 y.o. female referred by [facility] here today for evaluation of birth control options.   History was provided by the patient.  PCP Confirmed?  yes  My Chart Activated?   yes     History of Present Illness:  Birth control she wants, she sees what it does to people  Had postpartum depression; just did everything she could to get rid of  G1P1 delivered 07/19; vaginal delivery, high-risk FGR  When she got her period back, she feels like it is heavier  Has to wear 2-3 pads per day  Wants to stop it  Wanted to do the pill, and asked about Depo but is terrified of needles and asked  Before pregnancy, periods were monthly and heavy but not as heavy as now with clot  Terrible cramps/ felt like contractions with child birth, hurts when she walks and her back pain is terrible  Supposed to come on her period at the end of the month Normally bleeds 7 days Sexually active; no pain with intercourse Past contraception: patches - left dark marks on her arms where she wore it, has very sensitive skin Not breastfeeding, no hx of liver disease, migraine with aura, no known cancers  -intended birth spacing: about 3-4 years  LMP: end of last month    Review of Systems  Constitutional: Negative for chills, fever and malaise/fatigue.  HENT: Negative for sore throat.   Eyes: Negative for blurred vision and pain.  Respiratory: Negative for shortness of breath.   Cardiovascular: Negative for chest pain and palpitations.  Gastrointestinal: Negative for abdominal pain and nausea.  Genitourinary: Negative for dysuria and frequency.  Musculoskeletal: Negative for joint pain and myalgias.  Skin: Negative for rash.  Neurological: Negative for dizziness and headaches.  Psychiatric/Behavioral: Negative for depression and suicidal ideas. The patient is not nervous/anxious.      Allergies  Allergen Reactions  . Fish Allergy Nausea And Vomiting   Outpatient Medications Prior to Visit  Medication Sig Dispense Refill  . cyclobenzaprine (FLEXERIL) 5 MG tablet Take 1 tablet (5 mg total) by mouth 2 (two) times daily as needed for muscle spasms. 10 tablet 0  . ibuprofen (ADVIL) 800 MG tablet Take 1 tablet (800 mg total) by mouth 3 (three) times daily. 30 tablet 0  . naproxen (NAPROSYN) 500 MG tablet Take 1 tablet (500 mg total) by mouth 2 (two) times daily. 30 tablet 0  . Prenatal Vit-Fe Fumarate-FA (PRENATAL VITAMIN PO) Take by mouth.     No facility-administered medications prior to visit.     Patient Active Problem List   Diagnosis Date Noted  . High risk for Monosomy X (per Panorama) 07/01/2018  . Sickle cell trait (HCC) 05/29/2018  . Alpha thalassemia silent carrier 05/29/2018  .  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  yes Past Medical History:  Diagnosis Date  . ADHD (attention deficit hyperactivity disorder)     Family History: Reviewed and updated? yes Family History  Problem Relation Age of Onset  . Diabetes Maternal Aunt   . Cancer Maternal Grandmother   . Diabetes Maternal Grandmother   . Cancer Paternal Grandmother   . Diabetes  Paternal Grandmother     Confidentiality was discussed with the patient and if applicable, with caregiver as well.  Gender identity: Female Sex assigned at birth: Female Pronouns: she Tobacco?  no Drugs/ETOH?  no Partner preference?  female  Sexually Active?  yes  Pregnancy Prevention:  none Reviewed condoms:  yes Reviewed EC:  yes   Trusted adult at home/school:  yes Feels safe at home:  yes Trusted friends:  yesSuicidal or homicidal thoughts?   no Self injurious behaviors?  no  The following portions of the patient's history were reviewed and updated as appropriate: allergies, current medications, past family history, past medical history, past social history, past surgical history and problem list.  Visual Observations/Objective:   General Appearance: Well nourished well developed, in no apparent distress.  Eyes: conjunctiva no swelling or erythema ENT/Mouth: No hoarseness, No cough for duration of visit.  Neck: Supple  Respiratory: Respiratory effort normal, normal rate, no retractions or distress.   Cardio: Appears well-perfused, noncyanotic Musculoskeletal: no obvious deformity Skin: visible skin without rashes, ecchymosis, erythema Neuro: Awake and oriented X 3,  Psych:  normal affect, Insight and Judgment appropriate.    Assessment/Plan:  1. Menorrhagia with regular cycle 2. Dysmenorrhea 3. Encounter for BCP (birth control pills) initial prescription  G1P1 A/I/ female presents for birth control options with ideal method being OCPs. We reviewed Tier 1 and Tier 2 options including IUD, implant, Depo, Pill, Patch, Ring. Her goals for contraception method include birth control, managing painful cramps, and decreasing her period flow. We reviewed continuous cycling. No contraindications for COC use.  Rx sent to pharmacy.    I discussed the assessment and treatment plan with the patient and/or parent/guardian.  They were provided an opportunity to ask questions and all  were answered.  They agreed with the plan and demonstrated an understanding of the instructions. They were advised to call back or seek an in-person evaluation in the emergency room if the symptoms worsen or if the condition fails to improve as anticipated.   Follow-up:   8 weeks in person; complete gc/c at that time  Medical decision-making:   I spent 30 minutes on this telehealth visit inclusive of face-to-face video and care coordination time I was located at in office during this encounter.   Georges Mouse, NP    CC: [facility]

## 2020-01-31 ENCOUNTER — Encounter: Payer: Self-pay | Admitting: Family

## 2020-03-16 ENCOUNTER — Ambulatory Visit: Payer: Medicaid Other | Admitting: Family

## 2020-03-31 ENCOUNTER — Telehealth: Payer: Self-pay

## 2020-03-31 NOTE — Telephone Encounter (Signed)
Pt left message on VM requesting birth control refill of Loestrin Rx .   Call placed to pt. Spoke with pt. Pt states having irregular bleeding and cramps since starting birth control pills. Pt was started on OCPs on 01/20/20 by pediatrician. Pt states having LMP 02/04/20 and again on 03/06/2020 with normal flow only changing pad or tampons every 3-6 hours. Pt states having severe cramping for 1 hour each day with light spotting after taking OCPs. Pt denies any skipped or missed pills. Pt states takes pills every day at same time. Denies heavy bleeding or cramps after 1 hour each day. Pt states taking OTC  Ibuprofen and tylenol and does not resolve cramping.  Pt requesting something stronger for pain. Pt advised we do not treat pain over the phone and she will have to come in for OV. Pt agreeable and verbalized understanding.   Pt given number to office to call for appt.  Judeth Cornfield, RN  03/31/20 Encounter closed

## 2020-12-06 ENCOUNTER — Other Ambulatory Visit: Payer: Self-pay | Admitting: Family

## 2021-01-18 ENCOUNTER — Other Ambulatory Visit (HOSPITAL_COMMUNITY)
Admission: RE | Admit: 2021-01-18 | Discharge: 2021-01-18 | Disposition: A | Discharge status: Home / Self Care | Payer: Medicaid Other | Source: Ambulatory Visit | Attending: Family | Admitting: Family

## 2021-01-18 ENCOUNTER — Ambulatory Visit (INDEPENDENT_AMBULATORY_CARE_PROVIDER_SITE_OTHER): Payer: Medicaid Other | Admitting: Family

## 2021-01-18 ENCOUNTER — Other Ambulatory Visit: Payer: Self-pay

## 2021-01-18 ENCOUNTER — Encounter: Payer: Self-pay | Admitting: Family

## 2021-01-18 VITALS — BP 114/79 | HR 104 | Ht 66.54 in | Wt 190.0 lb

## 2021-01-18 DIAGNOSIS — M545 Low back pain, unspecified: Secondary | ICD-10-CM

## 2021-01-18 DIAGNOSIS — G8929 Other chronic pain: Secondary | ICD-10-CM

## 2021-01-18 DIAGNOSIS — Z3202 Encounter for pregnancy test, result negative: Secondary | ICD-10-CM

## 2021-01-18 DIAGNOSIS — Z113 Encounter for screening for infections with a predominantly sexual mode of transmission: Secondary | ICD-10-CM

## 2021-01-18 DIAGNOSIS — M25511 Pain in right shoulder: Secondary | ICD-10-CM | POA: Diagnosis not present

## 2021-01-18 DIAGNOSIS — Z3041 Encounter for surveillance of contraceptive pills: Secondary | ICD-10-CM

## 2021-01-18 MED ORDER — NORETHINDRONE ACET-ETHINYL EST 1.5-30 MG-MCG PO TABS: ORAL_TABLET | ORAL | 3 refills | Status: DC
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
## 2021-01-21 ENCOUNTER — Encounter: Payer: Self-pay | Admitting: Family

## 2021-01-21 LAB — POCT URINE PREGNANCY: Preg Test, Ur: NEGATIVE

## 2021-01-22 LAB — URINE CYTOLOGY ANCILLARY ONLY
Bacterial Vaginitis-Urine: NEGATIVE
Candida Urine: NEGATIVE
Chlamydia: NEGATIVE
Comment: NEGATIVE
Comment: NEGATIVE
Comment: NORMAL
Neisseria Gonorrhea: NEGATIVE
Trichomonas: NEGATIVE

## 2021-01-24 ENCOUNTER — Ambulatory Visit: Payer: Medicaid Other | Admitting: Physician Assistant

## 2021-01-24 ENCOUNTER — Ambulatory Visit (INDEPENDENT_AMBULATORY_CARE_PROVIDER_SITE_OTHER): Payer: Medicaid Other

## 2021-01-24 ENCOUNTER — Encounter: Payer: Self-pay | Admitting: Physician Assistant

## 2021-01-24 ENCOUNTER — Other Ambulatory Visit: Payer: Self-pay

## 2021-01-24 DIAGNOSIS — M545 Low back pain, unspecified: Secondary | ICD-10-CM | POA: Diagnosis not present

## 2021-01-24 DIAGNOSIS — M25511 Pain in right shoulder: Secondary | ICD-10-CM

## 2021-01-24 MED ORDER — METHYLPREDNISOLONE 4 MG PO TBPK: ORAL_TABLET | ORAL | 0 refills | Status: DC

## 2021-01-24 MED ORDER — CYCLOBENZAPRINE HCL 10 MG PO TABS: 10.0000 mg | ORAL_TABLET | Freq: Three times a day (TID) | ORAL | 0 refills | Status: DC | PRN
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
## 2021-02-27 ENCOUNTER — Inpatient Hospital Stay: Admission: RE | Admit: 2021-02-27 | Payer: Medicaid Other | Source: Ambulatory Visit

## 2021-09-15 ENCOUNTER — Other Ambulatory Visit: Payer: Self-pay

## 2021-09-15 ENCOUNTER — Encounter (HOSPITAL_COMMUNITY): Payer: Self-pay

## 2021-09-15 ENCOUNTER — Emergency Department (HOSPITAL_COMMUNITY): Payer: Medicaid Other

## 2021-09-15 ENCOUNTER — Emergency Department (HOSPITAL_COMMUNITY)
Admission: EM | Admit: 2021-09-15 | Discharge: 2021-09-15 | Disposition: A | Discharge status: Home / Self Care | Payer: Medicaid Other | Attending: Emergency Medicine | Admitting: Emergency Medicine

## 2021-09-15 DIAGNOSIS — M546 Pain in thoracic spine: Secondary | ICD-10-CM | POA: Insufficient documentation

## 2021-09-15 DIAGNOSIS — F17200 Nicotine dependence, unspecified, uncomplicated: Secondary | ICD-10-CM | POA: Insufficient documentation

## 2021-09-15 DIAGNOSIS — R079 Chest pain, unspecified: Secondary | ICD-10-CM | POA: Diagnosis not present

## 2021-09-15 MED ORDER — LIDOCAINE 5 % EX PTCH
1.0000 | MEDICATED_PATCH | CUTANEOUS | Status: DC
  Administered 2021-09-15: 1 via TRANSDERMAL
  Filled 2021-09-15: qty 1

## 2021-09-15 MED ORDER — IBUPROFEN 600 MG PO TABS: 600.0000 mg | ORAL_TABLET | Freq: Four times a day (QID) | ORAL | 0 refills | Status: DC | PRN

## 2021-09-15 MED ORDER — IBUPROFEN 200 MG PO TABS
600.0000 mg | ORAL_TABLET | Freq: Once | ORAL | Status: AC
  Administered 2021-09-15: 600 mg via ORAL
  Filled 2021-09-15: qty 3

## 2021-09-15 MED ORDER — LIDOCAINE 5 % EX PTCH: 1.0000 | MEDICATED_PATCH | CUTANEOUS | 0 refills | Status: DC

## 2021-09-15 MED ORDER — METHOCARBAMOL 500 MG PO TABS: 500.0000 mg | ORAL_TABLET | Freq: Two times a day (BID) | ORAL | 0 refills | Status: DC

## 2021-09-15 NOTE — Discharge Instructions (Addendum)
Take ibuprofen as prescribed every 6 hours for the next 3 to 5 days and get response.  You can use Lidoderm patch over 12 hours for the next 24 hours.  If the Lidoderm patches are too expensive at the pharmacy, you can get over-the-counter Salonpas patches.  They are very similar in the way that they work but much cheaper if your insurance does not cover the Lidoderm patches.  I will also prescribe a muscle relaxer.  Given that they make you drowsy, please do not take while you are at work or driving.  You can take them just before going to bed at night.  Provide information regarding a primary care provider to set up care with.  Look at information packet attached to discharge papers regarding stretching exercises to help with back pain.  Please do not hesitate to return to the emergency department for worrisome signs and symptoms we discussed become apparent.

## 2021-09-15 NOTE — ED Triage Notes (Signed)
Patient states she she has mid back pain x 1-2 weeks. Patient states she does a lot of heavy lifting at her job. Patient states she has pain that radiates into her legs at times when she stands too long.

## 2021-09-15 NOTE — ED Provider Notes (Signed)
[facility]-EMERGENCY DEPT Provider Note   CSN: [id number] Arrival date & time: 09/15/21  1003     History  Chief Complaint  Patient presents with   Back Pain    Joanna Henry is a 26 y.o. female.   Back Pain  26 year old female presents emergency department with complaints of back pain.  Patient states that event occurred approximately 2 to 3 weeks ago while at work.  She was lifting a heavy box when she noticed a pain in her right mid back.  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  She states "I cannot miss work for anything."  She has tried ibuprofen x1, muscle laxer x1, heat and cold compresses with minimal to no relief.  She states the pain has gotten worse since symptom onset because she has not rested her back appropriately.  She denies fever, saddle anesthesia, bowel/bowel or bladder dysfunction, IV drug use, cancer diagnosis, recent steroid use.  Patient is adamantly refusing needles of any kind including blood work or IV medications.  She is deemed to have capacity.  Past medical history significant for ADHD  Home Medications Prior to Admission medications   Medication Sig Start Date End Date Taking? Authorizing Provider  ibuprofen (ADVIL) 600 MG tablet Take 1 tablet (600 mg total) by mouth every 6 (six) hours as needed. 09/15/21  Yes Sherian Maroon A, PA  lidocaine (LIDODERM) 5 % Place 1 patch onto the skin daily. Remove & Discard patch within 12 hours or as directed by MD 09/15/21  Yes Sherian Maroon A, PA  methocarbamol (ROBAXIN) 500 MG tablet Take 1 tablet (500 mg total) by mouth 2 (two) times daily. 09/15/21  Yes Sherian Maroon A, PA  cyclobenzaprine (FLEXERIL) 10 MG tablet Take 1 tablet (10 mg total) by mouth 3 (three) times daily as needed for muscle spasms. 01/24/21   Persons, [place], PA  methylPREDNISolone (MEDROL DOSEPAK) 4 MG TBPK  tablet Take as directed on package 01/24/21   Persons, [place], PA  naproxen (NAPROSYN) 500 MG tablet Take 1 tablet (500 mg total) by mouth 2 (two) times daily. Patient not taking: Reported on 01/18/2021 07/24/19   Horton, Mayer Masker, MD  Norethindrone Acetate-Ethinyl Estradiol (AUROVELA 1.5/30) 1.5-30 MG-MCG tablet TAKE 1 TABLET BY MOUTH DAILY. TAKE ONE PILL EACH DAY FOR CONTINOUS CYCLING. 01/18/21   Georges Mouse, NP  Prenatal Vit-Fe Fumarate-FA (PRENATAL VITAMIN PO) Take by mouth. Patient not taking: Reported on 01/18/2021    [provider]      Allergies    Shellfish allergy and Fish allergy    Review of Systems   Review of Systems  Musculoskeletal:  Positive for back pain.  All other systems reviewed and are negative.   Physical Exam Updated Vital Signs BP 125/83 (BP Location: Left Arm)   Pulse 100   Temp 98.4 F (36.9 C) (Oral)   Resp 16   Ht 5\' 6"  (1.676 m)   Wt 81.6 kg   LMP 09/08/2021 (Approximate)   SpO2 99%   BMI 29.05 kg/m  Physical Exam Vitals and nursing note reviewed.  Constitutional:      General: She is not in acute distress.    Appearance: She is well-developed.  HENT:     Head: Normocephalic and atraumatic.  Eyes:     Conjunctiva/sclera: Conjunctivae normal.  Cardiovascular:  Rate and Rhythm: Normal rate and regular rhythm.     Heart sounds: No murmur heard. Pulmonary:     Effort: Pulmonary effort is normal. No respiratory distress.     Breath sounds: Normal breath sounds.  Abdominal:     Palpations: Abdomen is soft.     Tenderness: There is no abdominal tenderness. There is no right CVA tenderness or left CVA tenderness.  Musculoskeletal:        General: No swelling.     Cervical back: Neck supple.       Back:     Comments: No midline tenderness of cervical, thoracic, lumbar spine.  Tenderness to palpation along right paraspinal muscles of lower thoracic, upper lumbar region as depicted above.  Pain is exacerbated with  horizontal rotation of back as well as neck flexion and extension.  Patient has full active range of motion.  No overlying skin abnormality included swelling, erythema, areas of fluctuance/induration.  Posterior tibial and radial pulses full and intact bilaterally.  DTRs symmetric bilaterally for lower extremities.  Muscle strength 5 out of 5 bilaterally.  No sensory deficits along major restrictions of extremities.  Compartments soft nontender.  Straight leg raise negative.  Skin:    General: Skin is warm and dry.     Capillary Refill: Capillary refill takes less than 2 seconds.  Neurological:     Mental Status: She is alert.  Psychiatric:        Mood and Affect: Mood normal.     ED Results / Procedures / Treatments   Labs (all labs ordered are listed, but only abnormal results are displayed) Labs Reviewed - No data to display  EKG None  Radiology DG Chest 2 View  Result Date: 09/15/2021 CLINICAL DATA:  Pain EXAM: CHEST - 2 VIEW COMPARISON:  12/07/2014 chest radiograph. FINDINGS: Stable cardiomediastinal silhouette with normal heart size. No pneumothorax. No pleural effusion. New patchy upper right lung opacity. Clear left lung. IMPRESSION: New patchy upper right lung opacity, suspicious for pneumonia. Recommend follow-up PA and lateral post treatment chest radiographs in 4-6 weeks. Electronically Signed   By: Delbert Phenix M.D.   On: 09/15/2021 11:37    Procedures Procedures    Medications Ordered in ED Medications  lidocaine (LIDODERM) 5 % 1 patch (1 patch Transdermal Patch Applied 09/15/21 1150)  ibuprofen (ADVIL) tablet 600 mg (600 mg Oral Given 09/15/21 1150)    ED Course/ Medical Decision Making/ A&P Clinical Course as of 09/15/21 1234  Sat Sep 15, 2021  1226 Reevaluation of the patient after ibuprofen as well as Lidoderm showed significant improvement in patient's symptoms. [CR]    Clinical Course User Index [CR] Peter Garter, PA                           Medical  Decision Making Amount and/or Complexity of Data Reviewed Radiology: ordered.  Risk OTC drugs. Prescription drug management.   This patient presents to the ED for concern of back pain, this involves an extensive number of treatment options, and is a complaint that carries with it a high risk of complications and morbidity.  The differential diagnosis includes The emergent differential diagnosis for back pain includes but is not limited to fracture, muscle strain, cauda equina, spinal stenosis. DDD, ankylosing spondylitis, acute ligamentous injury, disk herniation, spondylolisthesis, Epidural compression syndrome, metastatic cancer, transverse myelitis, vertebral osteomyelitis, diskitis, kidney stone, pyelonephritis, AAA, Perforated ulcer, Retrocecal appendicitis, pancreatitis, bowel obstruction, retroperitoneal hemorrhage or mass,  meningitis.   Co morbidities that complicate the patient evaluation  ADHD   Lab Tests:  Patient refused laboratory studies.  She was deemed to have capacity.  Imaging Studies ordered:  I ordered imaging studies including chest x-ray I independently visualized and interpreted imaging which showed new patchy right upper lung opacity. I agree with the radiologist interpretation   Cardiac Monitoring: / EKG:  The patient was maintained on a cardiac monitor.  I personally viewed and interpreted the cardiac monitored which showed an underlying rhythm of: Sinus rhythm   Consultations Obtained:  N/a   Problem List / ED Course / Critical interventions / Medication management  Back pain I ordered medication including Lidoderm and ibuprofen for back pain  Reevaluation of the patient after these medicines showed that the patient improved I have reviewed the patients home medicines and have made adjustments as needed   Social Determinants of Health:  Work requires significant physical activity.  E-cigarette use.  Denies illicit drug use.   Test /  Admission - Considered:  Back pain Vitals signs within normal range and stable throughout visit. Imaging studies significant for developing pneumonia.  Upon reevaluation the patient, patient stated that she has been having increase of cough recently.  She attributed it to her e-cigarette use but is since stopped. Given significant improvement with ibuprofen as well as Lidoderm patch while in emergency department, patient will be prescribed outpatient with ibuprofen, Lidoderm, muscle relaxer for associated symptoms.  Antibiotic will also be prescribed for developing pneumonia as seen on chest x-ray.  Doubt cauda equina/spinal epidural abscess given lack of red flag signs.  Doubt transverse myelitis.  Doubt abdominal pathology given lack of associated symptoms.  Patient's symptoms likely related to muscular strain.  Will treat accordingly. Patient declined any intervention requiring a needle multiple times.  She was deemed to have Capacity. Worrisome signs and symptoms were discussed with the patient, and the patient acknowledged understanding to return to the ED if noticed. Patient was stable upon discharge.         Final Clinical Impression(s) / ED Diagnoses Final diagnoses:  Acute right-sided thoracic back pain    Rx / DC Orders ED Discharge Orders          Ordered    ibuprofen (ADVIL) 600 MG tablet  Every 6 hours PRN        09/15/21 1233    lidocaine (LIDODERM) 5 %  Every 24 hours        09/15/21 1233    methocarbamol (ROBAXIN) 500 MG tablet  2 times daily        09/15/21 1233              Peter Garter, [place] 09/15/21 1234    Lorre Nick, MD 09/17/21 1439

## 2021-10-22 ENCOUNTER — Other Ambulatory Visit: Payer: Self-pay | Admitting: Family

## 2021-10-22 DIAGNOSIS — Z3041 Encounter for surveillance of contraceptive pills: Secondary | ICD-10-CM

## 2022-09-08 ENCOUNTER — Other Ambulatory Visit: Payer: Self-pay | Admitting: Family

## 2022-09-08 DIAGNOSIS — Z3041 Encounter for surveillance of contraceptive pills: Secondary | ICD-10-CM

## 2022-09-16 ENCOUNTER — Other Ambulatory Visit: Payer: Self-pay | Admitting: Family

## 2022-09-16 DIAGNOSIS — Z3041 Encounter for surveillance of contraceptive pills: Secondary | ICD-10-CM

## 2022-10-01 ENCOUNTER — Other Ambulatory Visit: Payer: Self-pay | Admitting: Family

## 2022-10-01 DIAGNOSIS — Z3041 Encounter for surveillance of contraceptive pills: Secondary | ICD-10-CM

## 2022-11-05 ENCOUNTER — Ambulatory Visit (INDEPENDENT_AMBULATORY_CARE_PROVIDER_SITE_OTHER): Payer: Medicaid Other

## 2022-11-05 ENCOUNTER — Encounter (HOSPITAL_COMMUNITY): Payer: Self-pay | Admitting: Emergency Medicine

## 2022-11-05 ENCOUNTER — Other Ambulatory Visit: Payer: Self-pay

## 2022-11-05 ENCOUNTER — Ambulatory Visit (HOSPITAL_COMMUNITY)
Admission: EM | Admit: 2022-11-05 | Discharge: 2022-11-05 | Disposition: A | Discharge status: Home / Self Care | Payer: Medicaid Other | Attending: Internal Medicine | Admitting: Internal Medicine

## 2022-11-05 DIAGNOSIS — K0889 Other specified disorders of teeth and supporting structures: Secondary | ICD-10-CM

## 2022-11-05 DIAGNOSIS — Z043 Encounter for examination and observation following other accident: Secondary | ICD-10-CM | POA: Diagnosis not present

## 2022-11-05 DIAGNOSIS — S63501A Unspecified sprain of right wrist, initial encounter: Secondary | ICD-10-CM | POA: Diagnosis not present

## 2022-11-05 MED ORDER — AMOXICILLIN 500 MG PO CAPS: 500.0000 mg | ORAL_CAPSULE | Freq: Three times a day (TID) | ORAL | 0 refills | Status: DC

## 2022-11-05 NOTE — ED Provider Notes (Signed)
[facility]    CSN: [id number] Arrival date & time: 11/05/22  1629      History   Chief Complaint Chief Complaint  Patient presents with   Dental Pain   Wrist Pain    HPI Joanna Henry is a 27 y.o. female.   Here today for evaluation of right wrist pain after she accidentally fell onto her wrist awkwardly this morning when she tripped over her child's toy.  She states that she has had pain with movement of her wrist since that time.  She has not had numbness.  She also reports some pain around the right lower molar that has been broken.  She has tried ibuprofen without significant improvement.  She has not had fever.  She does not currently have a dentist.  The history is provided by the patient.    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 Authorizing Provider  amoxicillin (AMOXIL) 500 MG capsule Take 1 capsule (500 mg total) by mouth 3 (three) times daily. 11/05/22  Yes Tomi Bamberger, PA-C  ibuprofen (ADVIL) 600 MG tablet Take 1 tablet (600 mg total) by mouth every 6 (six) hours as needed. 09/15/21   Peter Garter, PA    Family History Family History  Problem Relation Age of Onset   Diabetes Maternal Aunt    Cancer Maternal Grandmother    Diabetes Maternal Grandmother     Cancer Paternal Grandmother    Diabetes Paternal Grandmother     Social History Social History   Tobacco Use   Smoking status: Never   Smokeless tobacco: Never  Vaping Use   Vaping status: Some Days   Substances: Nicotine, Flavoring  Substance Use Topics   Alcohol use: Yes   Drug use: Not Currently    Types: Marijuana    Comment: daily when unable to eat     Allergies   Shellfish allergy and Fish allergy   Review of Systems Review of Systems  Constitutional:  Negative for chills and fever.  HENT:  Positive for dental problem.   Eyes:  Negative for discharge and redness.  Respiratory:  Negative for shortness of breath.   Gastrointestinal:  Negative for abdominal pain, nausea and vomiting.  Musculoskeletal:  Positive for arthralgias. Negative for joint swelling.  Neurological:  Negative for numbness.     Physical Exam Triage Vital Signs ED Triage Vitals  Encounter Vitals Group     BP      Systolic BP Percentile  Diastolic BP Percentile      Pulse      Resp      Temp      Temp src      SpO2      Weight      Height      Head Circumference      Peak Flow      Pain Score      Pain Loc      Pain Education      Exclude from Growth Chart    No data found.  Updated Vital Signs BP 127/85 (BP Location: Left Arm)   Pulse 92   Temp 98.9 F (37.2 C) (Oral)   Resp 16   LMP 10/21/2022   SpO2 98%      Physical Exam Vitals and nursing note reviewed.  Constitutional:      General: She is not in acute distress.    Appearance: Normal appearance. She is not ill-appearing.  HENT:     Head: Normocephalic and atraumatic.     Mouth/Throat:     Comments: Broken right lower molar with surrounding gingival swelling and erythema Eyes:     Conjunctiva/sclera: Conjunctivae normal.  Cardiovascular:     Rate and Rhythm: Normal rate.  Pulmonary:     Effort: Pulmonary effort is normal. No respiratory distress.  Musculoskeletal:     Comments: No significant  swelling or deformity noted to right wrist.  Decreased range of motion of right wrist in all directions due to pain.  Skin:    Capillary Refill: Normal cap refill to right fingers Neurological:     Mental Status: She is alert.     Comments: Gross sensation intact to right fingers distally  Psychiatric:        Mood and Affect: Mood normal.        Behavior: Behavior normal.        Thought Content: Thought content normal.      UC Treatments / Results  Labs (all labs ordered are listed, but only abnormal results are displayed) Labs Reviewed - No data to display  EKG   Radiology No results found.  Procedures Procedures (including critical care time)  Medications Ordered in UC Medications - No data to display  Initial Impression / Assessment and Plan / UC Course  I have reviewed the triage vital signs and the nursing notes.  Pertinent labs & imaging results that were available during my care of the patient were reviewed by me and considered in my medical decision making (see chart for details).    No fracture noted on wrist x-ray.  Brace applied in office for hopeful improvement.  Advised ibuprofen and Tylenol if needed for pain.  Encouraged follow-up with Ortho if no gradual improvement.  Oxacillin prescribed for suspected dental abscess.  Advised patient to see dentist as soon as possible.  Final Clinical Impressions(s) / UC Diagnoses   Final diagnoses:  Sprain of right wrist, initial encounter  Pain, dental   Discharge Instructions   None    ED Prescriptions     Medication Sig Dispense Auth. Provider   amoxicillin (AMOXIL) 500 MG capsule Take 1 capsule (500 mg total) by mouth 3 (three) times daily. 21 capsule Tomi Bamberger, PA-C      PDMP not reviewed this encounter.   Tomi Bamberger, PA-C 11/05/22 1900

## 2022-11-05 NOTE — ED Triage Notes (Signed)
Tripped over a childs toy.  Landed on right wrist awkwardly  patient is unable to rotate right arm.  Patient has cracked/broken tooth on right side of mouth.    Patient has taken ibuprofen.

## 2022-11-05 NOTE — ED Notes (Signed)
18:10 provided ice pack

## 2022-11-05 NOTE — ED Notes (Signed)
Reviewed work note 

## 2022-11-06 ENCOUNTER — Ambulatory Visit (HOSPITAL_COMMUNITY): Payer: Self-pay

## 2022-12-11 ENCOUNTER — Telehealth: Payer: Self-pay | Admitting: General Practice

## 2022-12-11 NOTE — Telephone Encounter (Signed)
Patient called in and left message on nurse voicemail line requesting a call back about getting back on her birth control pills.   Called patient and discussed we haven't seen her in several years and will need to bring her in for an appt before we can send in a prescription for her. Patient verbalized understanding. Told patient someone from our front office will reach out to her with an appt.

## 2023-01-23 ENCOUNTER — Telehealth: Payer: Self-pay

## 2023-01-23 ENCOUNTER — Encounter: Payer: Medicaid Other | Admitting: Obstetrics and Gynecology

## 2023-01-23 NOTE — Telephone Encounter (Signed)
Pt called wanted to reschedule her missed appt.  Message routed to front office for scheduling.   Yanuel Tagg,RN  01/23/23

## 2023-03-11 ENCOUNTER — Ambulatory Visit: Payer: Medicaid Other | Admitting: Obstetrics

## 2023-04-29 ENCOUNTER — Telehealth: Payer: Medicaid Other | Admitting: Physician Assistant

## 2023-04-29 DIAGNOSIS — Z30011 Encounter for initial prescription of contraceptive pills: Secondary | ICD-10-CM

## 2023-04-29 NOTE — Patient Instructions (Signed)
  Joanna Henry, thank you for joining Joanna Climes, PA-C for today's virtual visit.  While this provider is not your primary care provider (PCP), if your PCP is located in our provider database this encounter information will be shared with them immediately following your visit.   A [facility] MyChart account gives you access to today's visit and all your visits, tests, and labs performed at [facility] " click here if you don't have a [facility] MyChart account or go to [email or web address]  Consent: (Patient) Joanna Henry provided verbal consent for this virtual visit at the beginning of the encounter.  Current Medications:  Current Outpatient Medications:    amoxicillin (AMOXIL) 500 MG capsule, Take 1 capsule (500 mg total) by mouth 3 (three) times daily., Disp: 21 capsule, Rfl: 0   ibuprofen (ADVIL) 600 MG tablet, Take 1 tablet (600 mg total) by mouth every 6 (six) hours as needed., Disp: 30 tablet, Rfl: 0   Medications ordered in this encounter:  No orders of the defined types were placed in this encounter.    *If you need refills on other medications prior to your next appointment, please contact your pharmacy*  Follow-Up: Call back or seek an in-person evaluation if the symptoms worsen or if the condition fails to improve as anticipated.  [facility] [phone]  Other Instructions Use the links to be seen in person for contraceptives.  If you have been instructed to have an in-person evaluation today at a local Urgent Care facility, please use the link below. It will take you to a list of all of our available [facility], including address, phone number and hours of operation. Please do not delay care.  [facility]  If you or a family member do not have a primary care provider, use the link below to schedule a visit and establish care. When you choose a [facility] primary care physician or advanced  practice provider, you gain a long-term partner in health. Find a Primary Care Provider  Learn more about [facility]'s in-office and virtual care options: [facility] - Get Care Now

## 2023-04-29 NOTE — Progress Notes (Signed)
 Virtual Visit Consent   Joanna Henry, you are scheduled for a virtual visit with a [facility] provider today. Just as with appointments in the office, your consent must be obtained to participate. Your consent will be active for this visit and any virtual visit you may have with one of our providers in the next 365 days. If you have a MyChart account, a copy of this consent can be sent to you electronically.  As this is a virtual visit, video technology does not allow for your provider to perform a traditional examination. This may limit your provider's ability to fully assess your condition. If your provider identifies any concerns that need to be evaluated in person or the need to arrange testing (such as labs, EKG, etc.), we will make arrangements to do so. Although advances in technology are sophisticated, we cannot ensure that it will always work on either your end or our end. If the connection with a video visit is poor, the visit may have to be switched to a telephone visit. With either a video or telephone visit, we are not always able to ensure that we have a secure connection.  By engaging in this virtual visit, you consent to the provision of healthcare and authorize for your insurance to be billed (if applicable) for the services provided during this visit. Depending on your insurance coverage, you may receive a charge related to this service.  I need to obtain your verbal consent now. Are you willing to proceed with your visit today? Joanna Henry has provided verbal consent on 04/29/2023 for a virtual visit (video or telephone). [name], [place]  Date: 04/29/2023 2:17 PM   Virtual Visit via Video Note   I, [name], connected with  Joanna Henry  ([id number], Sep 21, 1995) on 04/29/23 at  2:15 PM EST by a video-enabled telemedicine application and verified that I am speaking with the correct person using two identifiers.  Location: Patient: Virtual Visit  Location Patient: Home Provider: Virtual Visit Location Provider: Home Office   I discussed the limitations of evaluation and management by telemedicine and the availability of in person appointments. The patient expressed understanding and agreed to proceed.    History of Present Illness: Joanna Henry is a 28 y.o. who identifies as a female who was assigned female at birth, and is being seen today for request to restart oral contraceptives for birth control. Is not currently taking anything. Also requesting ongoing script for Plan B.  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  Resting comfortably at home.  Head is normocephalic, atraumatic.  No labored breathing. Speech is clear and coherent with logical content.  Patient is alert and oriented at baseline.   Assessment and Plan: 1. Encounter for initial prescription of contraceptive pills (Primary)  Discussed we do not provide contraception via virtual urgent care.  She is currently without PCP or GYN. Discussed resources so she can get set up and get restarted on medication. No charge.  Follow Up Instructions: I discussed the assessment and treatment plan with the patient. The patient was provided an opportunity to ask questions and all were answered. The patient agreed with the plan and demonstrated an understanding of  the instructions.  A copy of instructions were sent to the patient via MyChart unless otherwise noted below.   The patient was advised to call back or seek an in-person evaluation if the symptoms worsen or if the condition fails to improve as anticipated.    [name], PA-C

## 2023-05-03 ENCOUNTER — Encounter (HOSPITAL_COMMUNITY): Payer: Self-pay | Admitting: *Deleted

## 2023-05-03 ENCOUNTER — Other Ambulatory Visit: Payer: Self-pay

## 2023-05-03 ENCOUNTER — Ambulatory Visit (HOSPITAL_COMMUNITY)
Admission: EM | Admit: 2023-05-03 | Discharge: 2023-05-03 | Disposition: A | Discharge status: Home / Self Care | Attending: Emergency Medicine | Admitting: Emergency Medicine

## 2023-05-03 DIAGNOSIS — K047 Periapical abscess without sinus: Secondary | ICD-10-CM

## 2023-05-03 DIAGNOSIS — K0889 Other specified disorders of teeth and supporting structures: Secondary | ICD-10-CM

## 2023-05-03 DIAGNOSIS — M5442 Lumbago with sciatica, left side: Secondary | ICD-10-CM | POA: Diagnosis not present

## 2023-05-03 MED ORDER — PREDNISONE 10 MG PO TABS: ORAL_TABLET | ORAL | 0 refills | Status: AC

## 2023-05-03 MED ORDER — NAPROXEN 500 MG PO TABS: 500.0000 mg | ORAL_TABLET | Freq: Two times a day (BID) | ORAL | 0 refills | Status: AC

## 2023-05-03 MED ORDER — AMOXICILLIN-POT CLAVULANATE 875-125 MG PO TABS: 1.0000 | ORAL_TABLET | Freq: Two times a day (BID) | ORAL | 0 refills | Status: AC

## 2023-05-03 NOTE — ED Triage Notes (Signed)
 PT reports a broken tooth . Pt has tried OTC fo rpain.

## 2023-05-03 NOTE — Discharge Instructions (Addendum)
 Start taking Augmentin twice daily for 7 days for dental infection coverage. I have provided you with a list of dental resources that you can follow-up with regarding dental pain.   Start taking prednisone as prescribed to help with inflammation causing your back pain.   You can also take Naproxen twice daily as needed for pain. Do not take this with other NSAIDs such as ibuprofen, advil, aleve, and motrin. You can alternate this with Tylenol. Otherwise you can apply heat and do gentle stretching to help with pain. Return here as needed.

## 2023-05-03 NOTE — ED Provider Notes (Signed)
 [facility]    CSN: [id number] Arrival date & time: 05/03/23  1632      History   Chief Complaint Chief Complaint  Patient presents with   Dental Pain   Leg Pain    HPI Joanna Henry is a 28 y.o. female.   Patient presents with right lower dental pain that began last night. Patient states that she noticed her right back molar is chipped last night, but not sure when this occurred. Denies fever.  Patient also reports chronic low back that has worsened over the last 4 days. Patient reports pain that radiates down her left leg. Denies numbness, tongling, and weakness. Denies any known injury.   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 Authorizing Provider  amoxicillin-clavulanate (AUGMENTIN) 875-125 MG tablet Take 1 tablet by mouth every 12 (twelve) hours. 05/03/23  Yes Susann Givens, Eliska Hamil A, NP  naproxen (NAPROSYN) 500 MG tablet Take 1 tablet (500 mg total) by mouth 2 (two) times daily. 05/03/23  Yes Wynonia Lawman A, NP  predniSONE (DELTASONE) 10 MG tablet Take 4 tabs by mouth daily for 3 days, then 3 tabs for 3 days, then 2 tabs for 2 days, then 1 tab for 2 days 05/03/23  Yes Letta Kocher, NP    Family History Family History  Problem Relation Age of Onset    Diabetes Maternal Aunt    Cancer Maternal Grandmother    Diabetes Maternal Grandmother    Cancer Paternal Grandmother    Diabetes Paternal Grandmother     Social History Social History   Tobacco Use   Smoking status: Never   Smokeless tobacco: Never  Vaping Use   Vaping status: Some Days   Substances: Nicotine, Flavoring  Substance Use Topics   Alcohol use: Yes   Drug use: Not Currently    Types: Marijuana    Comment: daily when unable to eat     Allergies   Shellfish allergy and Fish allergy   Review of Systems Review of Systems  Per HPI  Physical Exam Triage Vital Signs ED Triage Vitals  Encounter Vitals Group     BP 05/03/23 1724 108/75     Systolic BP Percentile --      Diastolic BP Percentile --      Pulse Rate 05/03/23 1724 81     Resp 05/03/23 1724 18     Temp 05/03/23 1724 97.9 F (36.6 C)     Temp src --  SpO2 05/03/23 1724 98 %     Weight --      Height --      Head Circumference --      Peak Flow --      Pain Score 05/03/23 1721 10     Pain Loc --      Pain Education --      Exclude from Growth Chart --    No data found.  Updated Vital Signs BP 108/75   Pulse 81   Temp 97.9 F (36.6 C)   Resp 18   LMP 04/17/2023   SpO2 98%   Visual Acuity Right Eye Distance:   Left Eye Distance:   Bilateral Distance:    Right Eye Near:   Left Eye Near:    Bilateral Near:     Physical Exam Vitals and nursing note reviewed.  Constitutional:      General: She is awake. She is not in acute distress.    Appearance: Normal appearance. She is well-developed and well-groomed. She is not ill-appearing.  HENT:     Mouth/Throat:     Dentition: Abnormal dentition. Dental tenderness, gingival swelling and dental caries present.  Musculoskeletal:     Cervical back: Normal.     Thoracic back: Normal.     Lumbar back: Tenderness present. No swelling or bony tenderness. Normal range of motion. Positive left straight leg raise test.  Neurological:      Mental Status: She is alert.  Psychiatric:        Behavior: Behavior is cooperative.      UC Treatments / Results  Labs (all labs ordered are listed, but only abnormal results are displayed) Labs Reviewed - No data to display  EKG   Radiology No results found.  Procedures Procedures (including critical care time)  Medications Ordered in UC Medications - No data to display  Initial Impression / Assessment and Plan / UC Course  I have reviewed the triage vital signs and the nursing notes.  Pertinent labs & imaging results that were available during my care of the patient were reviewed by me and considered in my medical decision making (see chart for details).     Upon assessment broken tooth and dental cavity noted to furthest back molar with surrounding gingival swelling and tenderness.  Tenderness noted to bilateral low back with positive straight leg test on left side.   Offered Toradol and Decadron injection to help with pain and inflammation, patient declined. Prescribed Augmentin for dental infection coverage. Prescribed Naproxen and prednisone taper for pain and inflammation. Discussed follow-up and return precautions.  Final Clinical Impressions(s) / UC Diagnoses   Final diagnoses:  Acute bilateral low back pain with left-sided sciatica  Pain, dental  Dental infection     Discharge Instructions      Start taking Augmentin twice daily for 7 days for dental infection coverage. I have provided you with a list of dental resources that you can follow-up with regarding dental pain.   Start taking prednisone as prescribed to help with inflammation causing your back pain.   You can also take Naproxen twice daily as needed for pain. Do not take this with other NSAIDs such as ibuprofen, advil, aleve, and motrin. You can alternate this with Tylenol. Otherwise you can apply heat and do gentle stretching to help with pain. Return here as needed.     ED Prescriptions      Medication Sig Dispense Auth. Provider   amoxicillin-clavulanate (AUGMENTIN) 875-125 MG tablet Take  1 tablet by mouth every 12 (twelve) hours. 14 tablet Susann Givens, Gesenia Bantz A, NP   naproxen (NAPROSYN) 500 MG tablet Take 1 tablet (500 mg total) by mouth 2 (two) times daily. 30 tablet Susann Givens, Sayge Brienza A, NP   predniSONE (DELTASONE) 10 MG tablet Take 4 tabs by mouth daily for 3 days, then 3 tabs for 3 days, then 2 tabs for 2 days, then 1 tab for 2 days 28 tablet Wynonia Lawman A, NP      I have reviewed the PDMP during this encounter.   Wynonia Lawman A, NP 05/03/23 [phone]

## 2023-05-17 ENCOUNTER — Ambulatory Visit (HOSPITAL_COMMUNITY): Admission: EM | Admit: 2023-05-17 | Discharge: 2023-05-17 | Disposition: A | Discharge status: Home / Self Care

## 2023-05-17 ENCOUNTER — Encounter (HOSPITAL_COMMUNITY): Payer: Self-pay

## 2023-05-17 DIAGNOSIS — K0889 Other specified disorders of teeth and supporting structures: Secondary | ICD-10-CM | POA: Diagnosis not present

## 2023-05-17 NOTE — ED Triage Notes (Signed)
 Patient here today with c/o left upper dental pain today after her tooth broke. Patient was here 2 weeks ago for a different tooth that broke. Patient has been taking the medication prescribed with some improvement but not with the other tooth breaking, she has been having worsening pain. Patient requesting something for pain and a doctor's note until she see's the dentist on Monday.

## 2023-05-17 NOTE — ED Provider Notes (Signed)
 [facility]    CSN: [id number] Arrival date & time: 05/17/23  1633      History   Chief Complaint Chief Complaint  Patient presents with   Dental Pain    HPI GIRL SCHISSLER is a 28 y.o. female.   Patient presents with left upper dental pain today after her tooth broke.  Patient was seen here 2 weeks ago for a broken tooth a different broken tooth.  Patient was prescribed Augmentin for possible dental infection coverage and naproxen for pain.  Patient denies taking Augmentin.  Patient states she has taken naproxen twice today for pain without relief.  Patient states that ibuprofen, naproxen, and Tylenol have not been helping with dental pain and is requesting something stronger.   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 Authorizing Provider  amoxicillin-clavulanate (AUGMENTIN) 875-125 MG tablet Take 1 tablet by mouth every 12 (twelve) hours. 05/03/23  Yes Susann Givens, Dorlis Judice A, NP  naproxen (NAPROSYN) 500 MG tablet Take 1 tablet (500 mg total) by mouth 2 (two) times daily. 05/03/23  Yes Wynonia Lawman A, NP  predniSONE (DELTASONE) 10 MG tablet Take 4 tabs by mouth daily for 3 days, then 3 tabs for 3 days, then 2 tabs for 2 days, then 1 tab for 2 days 05/03/23  Yes Letta Kocher, NP    Family History Family History  Problem Relation Age of Onset   Diabetes Maternal Aunt    Cancer Maternal Grandmother    Diabetes Maternal Grandmother    Cancer Paternal Grandmother    Diabetes Paternal Grandmother     Social History Social History   Tobacco Use   Smoking status: Never   Smokeless tobacco: Never  Vaping Use   Vaping status: Some Days   Substances: Nicotine, Flavoring  Substance Use Topics   Alcohol use: Yes   Drug use: Not Currently    Types: Marijuana    Comment: daily when unable to eat     Allergies   Shellfish allergy and Fish allergy   Review of Systems Review of Systems  Per HPI  Physical Exam Triage Vital Signs ED Triage Vitals  Encounter Vitals Group     BP 05/17/23 1816 137/74     Systolic BP Percentile --      Diastolic BP Percentile --      Pulse Rate 05/17/23 1816 93     Resp 05/17/23 1816 16     Temp 05/17/23 1816 98 F (36.7  C)     Temp Source 05/17/23 1816 Oral     SpO2 05/17/23 1816 98 %     Weight --      Height --      Head Circumference --      Peak Flow --      Pain Score 05/17/23 1812 10     Pain Loc --      Pain Education --      Exclude from Growth Chart --    No data found.  Updated Vital Signs BP 137/74 (BP Location: Left Arm)   Pulse 93   Temp 98 F (36.7 C) (Oral)   Resp 16   LMP 05/17/2023 (Exact Date)   SpO2 98%   Visual Acuity Right Eye Distance:   Left Eye Distance:   Bilateral Distance:    Right Eye Near:   Left Eye Near:    Bilateral Near:     Physical Exam Vitals and nursing note reviewed.  Constitutional:      General: She is awake. She is not in acute distress.    Appearance: Normal appearance. She is well-developed and well-groomed. She is not ill-appearing.  HENT:     Mouth/Throat:     Dentition: Abnormal dentition. Dental tenderness present. No gingival swelling or dental abscesses.      Comments: Broken left upper canine tooth noted.  Without surrounding  gingival swelling or obvious infection. Neurological:     Mental Status: She is alert.  Psychiatric:        Behavior: Behavior is cooperative.      UC Treatments / Results  Labs (all labs ordered are listed, but only abnormal results are displayed) Labs Reviewed - No data to display  EKG   Radiology No results found.  Procedures Procedures (including critical care time)  Medications Ordered in UC Medications - No data to display  Initial Impression / Assessment and Plan / UC Course  I have reviewed the triage vital signs and the nursing notes.  Pertinent labs & imaging results that were available during my care of the patient were reviewed by me and considered in my medical decision making (see chart for details).     Upon assessment broken left upper canine tooth noted.  Without surrounding gingival swelling or obvious infection.  Recommended that patient continue with current treatment plan that was prescribed for previous broken tooth.  Upon informing patient that I cannot provide her anything stronger for pain patient became visibly upset and stormed out of the room and then out of the exit of the urgent care.  Recommended that patient go to the ER if she continues to have severe pain as they can provide her further management.  After the front lobby of the urgent care was closed but patient was banging on the door demanding to come in for her discharge paperwork and a work note.  Patient provided with paperwork and work note. Final Clinical Impressions(s) / UC Diagnoses   Final diagnoses:  Pain, dental     Discharge Instructions      I recommend alternating previously prescribed naproxen and 1000 mg of Tylenol as needed for pain.  Keep your appointment with your dentist on Monday as they can provide further management and hopefully pain relief.  If your pain continues to be severe then I recommend going to the ER for further management of pain.  Return  here as needed.   ED Prescriptions   None    PDMP not reviewed  this encounter.   Wynonia Lawman A, NP 05/17/23 [phone]

## 2023-05-17 NOTE — Discharge Instructions (Signed)
 I recommend alternating previously prescribed naproxen and 1000 mg of Tylenol as needed for pain.  Keep your appointment with your dentist on Monday as they can provide further management and hopefully pain relief.  If your pain continues to be severe then I recommend going to the ER for further management of pain.  Return here as needed.

## 2023-07-01 ENCOUNTER — Ambulatory Visit: Admitting: Medical

## 2023-07-01 ENCOUNTER — Encounter: Payer: Self-pay | Admitting: Advanced Practice Midwife

## 2023-07-01 ENCOUNTER — Other Ambulatory Visit: Payer: Self-pay

## 2023-07-01 ENCOUNTER — Other Ambulatory Visit (HOSPITAL_COMMUNITY)
Admission: RE | Admit: 2023-07-01 | Discharge: 2023-07-01 | Disposition: A | Discharge status: Home / Self Care | Source: Ambulatory Visit | Attending: Advanced Practice Midwife | Admitting: Advanced Practice Midwife

## 2023-07-01 ENCOUNTER — Encounter: Payer: Self-pay | Admitting: Family Medicine

## 2023-07-01 VITALS — BP 119/84 | HR 92

## 2023-07-01 DIAGNOSIS — Z3202 Encounter for pregnancy test, result negative: Secondary | ICD-10-CM

## 2023-07-01 DIAGNOSIS — Z124 Encounter for screening for malignant neoplasm of cervix: Secondary | ICD-10-CM | POA: Insufficient documentation

## 2023-07-01 DIAGNOSIS — F419 Anxiety disorder, unspecified: Secondary | ICD-10-CM

## 2023-07-01 DIAGNOSIS — N920 Excessive and frequent menstruation with regular cycle: Secondary | ICD-10-CM | POA: Diagnosis not present

## 2023-07-01 DIAGNOSIS — Z3009 Encounter for other general counseling and advice on contraception: Secondary | ICD-10-CM

## 2023-07-01 LAB — POCT PREGNANCY, URINE: Preg Test, Ur: NEGATIVE

## 2023-07-01 MED ORDER — NORETHINDRONE ACET-ETHINYL EST 1.5-30 MG-MCG PO TABS: ORAL_TABLET | ORAL | 3 refills | Status: AC

## 2023-07-01 NOTE — Progress Notes (Signed)
    History:  Ms. Joanna Henry is a 28 y.o. G1P1001 who presents to clinic today for birth control counseling. Patient needs a pap smear. She states LMP 06/15/23. Periods are heavy and lasting 7 days. She has not been sexually active recently. She has previously use OCPs and took the continuously to avoid periods. She would like to try that again. She does have chronic back pain, but denies pelvic pain, vaginal bleeding, UTI symptoms today.   The following portions of the patient's history were reviewed and updated as appropriate: allergies, current medications, family history, past medical history, social history, past surgical history and problem list.  Review of Systems:  Review of Systems  Constitutional:  Negative for fever and malaise/fatigue.  Gastrointestinal:  Negative for abdominal pain, constipation and diarrhea.  Genitourinary:  Negative for dysuria, frequency and urgency.       Neg - vaginal bleeding, discharge, pelvic pain      Objective:  Physical Exam BP 119/84   Pulse 92   LMP 06/15/2023  Physical Exam Constitutional:      Appearance: Normal appearance. She is normal weight. She is not ill-appearing.  Cardiovascular:     Rate and Rhythm: Normal rate.  Pulmonary:     Effort: Pulmonary effort is normal.  Skin:    General: Skin is warm and dry.     Coloration: Skin is not pale.  Neurological:     Mental Status: She is alert and oriented to person, place, and time.       Labs and Imaging Results for orders placed or performed in visit on 07/01/23 (from the past 24 hours)  Pregnancy, urine POC     Status: None   Collection Time: 07/01/23 12:01 PM  Result Value Ref Range   Preg Test, Ur NEGATIVE NEGATIVE   Health Maintenance Due  Topic Date Due   Hepatitis C Screening  Never done   Cervical Cancer Screening (Pap smear)  Never done   DTaP/Tdap/Td (8 - Td or Tdap) 09/20/2018   COVID-19 Vaccine (1 - 2024-25 season) Never done    Labs, imaging and  previous visits in Epic and Care Everywhere reviewed  Assessment & Plan:  1. Birth control counseling (Primary) - Patient did not want to discuss other more invasive options for contraception today.   2. Menorrhagia with regular cycle - Norethindrone  Acetate-Ethinyl Estradiol (AUROVELA 1.5/30) 1.5-30 MG-MCG tablet; Take 1 pill daily at the same time, skip placebo to avoid period  Dispense: 84 tablet; Refill: 3 - Advised that when persistent spotting occurs, she will want to take placebo for a month to avoid irregular and unpredictable bleeding   3. Cervical cancer screening - Patient declined pelvic exam, self collected pap smear - Cytology - PAP( [place]) - Results will be available in MyChart   4. Anxious mood - Patient requests referral for anxiety, she has had depressed mood and anxiety since her partner passed away a few years ago and would like to talk to someone. Referral to [facility] entered.   Approximately 22 minutes of total time was spent with this patient on chart review, history taking, patient education, coordination of care and documentation.   Return in about 1 year (around 06/30/2024) for Annual exam.  Claria Crofts 07/01/2023 12:17 PM

## 2023-07-04 LAB — CYTOLOGY - PAP
Adequacy: ABSENT
Diagnosis: NEGATIVE

## 2023-07-31 ENCOUNTER — Ambulatory Visit: Payer: Self-pay | Admitting: Clinical

## 2023-07-31 DIAGNOSIS — F4322 Adjustment disorder with anxiety: Secondary | ICD-10-CM

## 2023-07-31 NOTE — Patient Instructions (Signed)
 [facility] for [facility] at [facility] [address] [place], [place] [address] [phone] (main office) [phone] (Konner Saiz's office)
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
## 2023-08-18 NOTE — BH Specialist Note (Deleted)
 Integrated Behavioral Health via Telemedicine Visit  08/18/2023 Joanna Henry [id number]  Number of Integrated Behavioral Health Clinician visits: 1- Initial Visit  Session Start time: 1317   Session End time: 1329  Total time in minutes: 12    Referring Provider: Army Landsman, PA-C Patient/Family location: Home*** [facility] Provider location: [facility]  All persons participating in visit: [name] and [facility] Darletta Noblett ***  Types of Service: {CHL AMB TYPE OF SERVICE:[phone]}  I connected with Joanna Henry and/or Joanna Henry's {family members:20773} via  Telephone or Engineer, civil (consulting)  (Video is Caregility application) and verified that I am speaking with the correct person using two identifiers. Discussed confidentiality: Yes   I discussed the limitations of telemedicine and the availability of in person appointments.  Discussed there is a possibility of technology failure and discussed alternative modes of communication if that failure occurs.  I discussed that engaging in this telemedicine visit, they consent to the provision of behavioral healthcare and the services will be billed under their insurance.  Patient and/or legal guardian expressed understanding and consented to Telemedicine visit: Yes   Presenting Concerns: Patient and/or family reports the following symptoms/concerns: *** Duration of problem: ***; Severity of problem: {Mild/Moderate/Severe:20260}  Patient and/or Family's Strengths/Protective Factors: {CHL AMB BH PROTECTIVE FACTORS:[phone]}  Goals Addressed: Patient will:  Reduce symptoms of: {IBH Symptoms:21014056}   Increase knowledge and/or ability of: {IBH Patient Tools:21014057}   Demonstrate ability to: {IBH Goals:21014053}  Progress towards Goals: {CHL AMB BH PROGRESS TOWARDS GOALS:[phone]}    Interventions: Interventions utilized:  {IBH  Interventions:21014054} Standardized Assessments completed: {IBH Screening Tools:21014051}    Patient and/or Family Response: Patient agrees with treatment plan.   Clinical Assessment/Diagnosis  No diagnosis found..   Patient may benefit from continued therapeutic intervention *** .  Plan: Follow up with behavioral health clinician on : *** Behavioral recommendations:  -*** -*** Referral(s): {IBH Referrals:21014055}  I discussed the assessment and treatment plan with the patient and/or parent/guardian. They were provided an opportunity to ask questions and all were answered. They agreed with the plan and demonstrated an understanding of the instructions.   They were advised to call back or seek an in-person evaluation if the symptoms worsen or if the condition fails to improve as anticipated.  Elfredia Grippe Kadijah Shamoon, LCSW     07/01/2023    5:26 PM 10/07/2018    2:58 PM 08/05/2018    8:49 AM  Depression screen PHQ 2/9  Decreased Interest 0 0 0  Down, Depressed, Hopeless 1 0 0  PHQ - 2 Score 1 0 0  Altered sleeping 2 1 1   Tired, decreased energy 0 0 0  Change in appetite 3 0 0  Feeling bad or failure about yourself  0 0 0  Trouble concentrating 0 0 0  Moving slowly or fidgety/restless 0 0 0  Suicidal thoughts 0 0 0  PHQ-9 Score 6 1 1       07/01/2023    5:26 PM 10/07/2018    3:01 PM 08/05/2018    8:48 AM  GAD 7 : Generalized Anxiety Score  Nervous, Anxious, on Edge 0 0 0  Control/stop worrying 3 0 0  Worry too much - different things 2 0 0  Trouble relaxing 3 0 1  Restless 1 0 1  Easily annoyed or irritable 0 0 1  Afraid - awful might happen 1 0 0  Total GAD 7 Score 10 0 3

## 2023-08-19 ENCOUNTER — Encounter

## 2023-09-03 ENCOUNTER — Encounter

## 2023-09-24 ENCOUNTER — Telehealth: Payer: Self-pay | Admitting: Clinical

## 2023-09-24 NOTE — Telephone Encounter (Signed)
 Attempt to reschedule; Unable to leave message as mailbox has not been set up.

## 2023-09-30 ENCOUNTER — Telehealth: Payer: Self-pay

## 2023-09-30 NOTE — Telephone Encounter (Signed)
 Pt called requesting refill for her birth control.   [place], RN

## 2023-10-02 NOTE — Telephone Encounter (Signed)
 Called patient and discussed refills are available at her pharmacy. Patient states she called them and was told she didn't. Called the pharmacy who confirms they will get the prescription ready for her. Called patient back and discussed with her. Told her she can start pills today and recommended back up protection for 1 week. Patient states she isn't sexually active and uses the pills for her periods which has been on for a week since she ran out of pills and it is heavy and very crampy. Discussed she could use ibuprofen  from the store which should also help with cramps and the flow amount. Patient verbalized understanding.

## 2023-10-23 ENCOUNTER — Ambulatory Visit: Admitting: Clinical

## 2023-10-23 DIAGNOSIS — Z91199 Patient's noncompliance with other medical treatment and regimen due to unspecified reason: Secondary | ICD-10-CM

## 2023-10-23 NOTE — BH Specialist Note (Signed)
Pt did not arrive to video visit and did not answer the phone; Unable to leave voice message as mailbox has not been set up; left MyChart message for patient.
